# Patient Record
Sex: Female | Born: 1980 | Race: White | Hispanic: No | Marital: Married | Smoking: Current some day smoker
Health system: Southern US, Community
[De-identification: ages and names within clinical notes are randomized; demographics above are authoritative.]

---

## 2002-07-01 ENCOUNTER — Other Ambulatory Visit: Admission: RE | Admit: 2002-07-01 | Discharge: 2002-07-01 | Payer: Self-pay | Admitting: Obstetrics & Gynecology

## 2003-10-13 ENCOUNTER — Other Ambulatory Visit: Admission: RE | Admit: 2003-10-13 | Discharge: 2003-10-13 | Payer: Self-pay | Admitting: Obstetrics and Gynecology

## 2008-10-21 ENCOUNTER — Ambulatory Visit: Payer: Self-pay | Admitting: Family Medicine

## 2008-10-21 DIAGNOSIS — G5780 Other specified mononeuropathies of unspecified lower limb: Secondary | ICD-10-CM | POA: Insufficient documentation

## 2008-10-21 DIAGNOSIS — R209 Unspecified disturbances of skin sensation: Secondary | ICD-10-CM

## 2008-10-25 LAB — CONVERTED CEMR LAB
ALT: 31 units/L (ref 0–35)
Albumin: 4.7 g/dL (ref 3.5–5.2)
CO2: 18 meq/L — ABNORMAL LOW (ref 19–32)
Chloride: 103 meq/L (ref 96–112)
Glucose, Bld: 83 mg/dL (ref 70–99)
Platelets: 354 10*3/uL (ref 150–400)
Potassium: 4.6 meq/L (ref 3.5–5.3)
RBC: 4.66 M/uL (ref 3.87–5.11)
Sodium: 139 meq/L (ref 135–145)
TSH: 2.534 microintl units/mL (ref 0.350–4.50)
Total Protein: 8 g/dL (ref 6.0–8.3)
Vitamin B-12: 770 pg/mL (ref 211–911)
WBC: 8.1 10*3/uL (ref 4.0–10.5)

## 2008-11-03 ENCOUNTER — Encounter: Payer: Self-pay | Admitting: Family Medicine

## 2008-11-03 ENCOUNTER — Ambulatory Visit: Payer: Self-pay | Admitting: Physical Medicine & Rehabilitation

## 2008-11-04 ENCOUNTER — Telehealth: Payer: Self-pay | Admitting: Family Medicine

## 2008-11-04 DIAGNOSIS — S8410XA Injury of peroneal nerve at lower leg level, unspecified leg, initial encounter: Secondary | ICD-10-CM | POA: Insufficient documentation

## 2008-11-05 ENCOUNTER — Encounter: Payer: Self-pay | Admitting: Family Medicine

## 2010-11-15 ENCOUNTER — Encounter: Payer: Self-pay | Admitting: Family Medicine

## 2011-02-07 NOTE — Letter (Signed)
Summary: [facility]  [facility]   Imported By: Kassie Mends 02/01/2011 08:24:38  _____________________________________________________________________  External Attachment:    Type:   Image     Comment:   External Document

## 2011-08-01 ENCOUNTER — Encounter: Payer: Self-pay | Admitting: Family Medicine

## 2011-08-07 ENCOUNTER — Encounter: Payer: Self-pay | Admitting: Family Medicine

## 2011-08-13 ENCOUNTER — Ambulatory Visit (INDEPENDENT_AMBULATORY_CARE_PROVIDER_SITE_OTHER): Payer: 59 | Admitting: Family Medicine

## 2011-08-13 ENCOUNTER — Encounter: Payer: Self-pay | Admitting: Family Medicine

## 2011-08-13 ENCOUNTER — Other Ambulatory Visit: Payer: Self-pay | Admitting: Family Medicine

## 2011-08-13 DIAGNOSIS — D229 Melanocytic nevi, unspecified: Secondary | ICD-10-CM

## 2011-08-13 DIAGNOSIS — D239 Other benign neoplasm of skin, unspecified: Secondary | ICD-10-CM

## 2011-08-13 DIAGNOSIS — Z23 Encounter for immunization: Secondary | ICD-10-CM
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
## 2011-08-13 NOTE — Progress Notes (Signed)
Addended by: Wyline Beady on: 08/13/2011 02:03 PM   Modules accepted: Orders

## 2011-08-14 ENCOUNTER — Telehealth: Payer: Self-pay | Admitting: Family Medicine

## 2011-08-14 NOTE — Telephone Encounter (Signed)
Cal pt: Bx shows just a normal mole that was inflammed.

## 2011-08-15 NOTE — Telephone Encounter (Signed)
Pt informed that her recent biopsy just showed a normal mole that had gotten inflammed. Jarvis Newcomer, LPN Dom[facility] Dimes

## 2012-02-25 ENCOUNTER — Ambulatory Visit (INDEPENDENT_AMBULATORY_CARE_PROVIDER_SITE_OTHER): Payer: 59 | Admitting: Physician Assistant

## 2012-02-25 ENCOUNTER — Encounter: Payer: Self-pay | Admitting: Physician Assistant

## 2012-02-25 VITALS — BP 117/82 | HR 71 | Temp 98.7°F | Ht 67.0 in | Wt 178.0 lb

## 2012-02-25 DIAGNOSIS — H109 Unspecified conjunctivitis: Secondary | ICD-10-CM

## 2012-02-25 MED ORDER — MOXIFLOXACIN HCL 0.5 % OP SOLN - NO CHARGE: 1.0000 [drp] | Freq: Three times a day (TID) | OPHTHALMIC | Status: DC

## 2012-02-25 NOTE — Progress Notes (Signed)
  Subjective:    Patient ID: April Jarvis, female    DOB: 26-Feb-1981, 31 y.o.   MRN: [id number]  Conjunctivitis  The current episode started 2 days ago. The onset was sudden. The problem occurs occasionally. The problem has been gradually worsening. The problem is mild. Relieved by: warm washclothes. The symptoms are aggravated by nothing. Associated symptoms include eye itching, eye discharge and eye redness. Pertinent negatives include no fever, no decreased vision, no double vision, no photophobia, no abdominal pain, no nausea, no vomiting, no congestion, no ear discharge, no ear pain, no headaches, no hearing loss, no rhinorrhea, no sore throat, no swollen glands, no cough, no URI, no wheezing and no eye pain.   82 month old daughter had conjunctivitis last week. It has resolved with antibiotic drops.   Review of Systems  Constitutional: Negative for fever.  HENT: Negative for hearing loss, ear pain, congestion, sore throat, rhinorrhea and ear discharge.   Eyes: Positive for discharge, redness and itching. Negative for double vision, photophobia and pain.  Respiratory: Negative for cough and wheezing.   Gastrointestinal: Negative for nausea, vomiting and abdominal pain.  Neurological: Negative for headaches.       Objective:   Physical Exam  Constitutional: She is oriented to person, place, and time. She appears well-developed and well-nourished.  HENT:  Head: Normocephalic and atraumatic.  Right Ear: External ear normal.  Left Ear: External ear normal.  Nose: Nose normal.  Mouth/Throat: Oropharynx is clear and moist.       TM's normal.  Eyes: Right eye exhibits no discharge. Left eye exhibits no discharge. Scleral icterus is present.       Conjunctivia injected in left eye with no discharge present in either eye.  Neck: Normal range of motion. Neck supple.  Cardiovascular: Normal rate, regular rhythm and normal heart sounds.   Pulmonary/Chest: Effort normal and breath  sounds normal.  Lymphadenopathy:    She has no cervical adenopathy.  Neurological: She is alert and oriented to person, place, and time.  Skin: Skin is warm and dry. There is erythema.  Psychiatric: She has a normal mood and affect. Her behavior is normal.          Assessment & Plan:  Conjunctivitis- Viagamox was given to use 3 times a day for 7 days. Call if not improving.

## 2012-02-25 NOTE — Patient Instructions (Signed)
Start Vigamox three times day for 7 days.

## 2012-02-27 ENCOUNTER — Encounter: Payer: Self-pay | Admitting: *Deleted

## 2012-03-04 ENCOUNTER — Encounter: Payer: Self-pay | Admitting: Physician Assistant

## 2012-03-04 ENCOUNTER — Ambulatory Visit (INDEPENDENT_AMBULATORY_CARE_PROVIDER_SITE_OTHER): Payer: 59 | Admitting: Physician Assistant

## 2012-03-04 VITALS — BP 135/94 | HR 63 | Wt 174.0 lb

## 2012-03-04 DIAGNOSIS — Z13220 Encounter for screening for lipoid disorders: Secondary | ICD-10-CM

## 2012-03-04 DIAGNOSIS — Z0000 Encounter for general adult medical examination without abnormal findings: Secondary | ICD-10-CM

## 2012-03-04 LAB — LIPID PANEL
HDL: 54 mg/dL (ref >39)
LDL Cholesterol: 130 mg/dL — ABNORMAL HIGH (ref 0–99)
Triglycerides: 86 mg/dL (ref <150)
VLDL: 17 mg/dL (ref 0–40)

## 2012-03-04 LAB — COMPLETE METABOLIC PANEL WITH GFR
ALT: 21 U/L (ref 0–35)
AST: 17 U/L (ref 0–37)
CO2: 24 mEq/L (ref 19–32)
Chloride: 105 mEq/L (ref 96–112)
Creat: 0.9 mg/dL (ref 0.50–1.10)
GFR, Est African American: >89 mL/min
Sodium: 138 mEq/L (ref 135–145)
Total Bilirubin: 0.6 mg/dL (ref 0.3–1.2)
Total Protein: 7.3 g/dL (ref 6.0–8.3)

## 2012-03-04 NOTE — Progress Notes (Signed)
Subjective:     April Jarvis is a 31 y.o. female and is here for a comprehensive physical exam. The patient reports problems - she is having some pain and popping in both shoulders that occurs off and on.  patient reports that the pain has been on and off for at least one year. It is not every day but usually a couple days a week. It is worse in the mornings when she gets up and gets better throughout the day. She denies any numbness or tingling bilaterally.She is having no pain today. She takes ibuprofen as needed and it does help with the pain.  Patient has really been working at weight loss. She is running at least 3 times a week and preparing to run a 5K at the end of this month. She believes that the phentarmine has helped her with her appetite she has lost 4 more pounds since last visit on March 11. She is also been more intentional about her diet. In the past she is taking a multivitamin however now she is not.  History   Social History  . Marital Status: Married    Spouse Name: N/A    Number of Children: N/A  . Years of Education: N/A   Occupational History  . Not on file.   Social History Main Topics  . Smoking status: Former Smoker -- 0.1 packs/day    Types: Cigarettes    Quit date: 07/17/2008  . Smokeless tobacco: Not on file  . Alcohol Use: 1.0 oz/week    2 drink(s) per week  . Drug Use: No  . Sexually Active: Not on file   Other Topics Concern  . Not on file   Social History Narrative  . No narrative on file   Health Maintenance  Topic Date Due  . Influenza Vaccine  09/26/2012  . Pap Smear  12/17/2014  . Tetanus/tdap  08/12/2021    The following portions of the patient's history were reviewed and updated as appropriate: allergies, current medications, past family history, past medical history, past social history, past surgical history and problem list.  Review of Systems Pertinent items are noted in HPI.   Objective:    BP 135/94  Pulse 63  Wt 174  lb (78.926 kg)  LMP 02/22/2012 General appearance: alert, cooperative and appears stated age Head: Normocephalic, without obvious abnormality, atraumatic Eyes: conjunctivae/corneas clear. PERRL, EOM's intact. Fundi benign. Ears: normal TM's and external ear canals both ears Nose: Nares normal. Septum midline. Mucosa normal. No drainage or sinus tenderness. Throat: lips, mucosa, and tongue normal; teeth and gums normal Neck: no adenopathy, no carotid bruit, no JVD, supple, symmetrical, trachea midline and thyroid not enlarged, symmetric, no tenderness/mass/nodules Back: symmetric, no curvature. ROM normal. No CVA tenderness. Lungs: clear to auscultation bilaterally Heart: regular rate and rhythm, S1, S2 normal, no murmur, click, rub or gallop Abdomen: soft, non-tender; bowel sounds normal; no masses,  no organomegaly Extremities: extremities normal, atraumatic, no cyanosis or edema and ROM in both shoulders is normal and without pain. No pain to palpation of both shoulders and strength is 5/5.  Pulses: 2+ and symmetric Skin: Skin color, texture, turgor normal. No rashes or lesions Lymph nodes: Cervical, supraclavicular, and axillary nodes normal. Neurologic: Grossly normal    Assessment:    Healthy female exam.      Plan:    Continue with running and cardio exercises at least 3 times a week. Maintain high protein diet that is balanced. We will call with lab  result in 2-3 days. Make sure MVI with at least 500mg  Calcium twice a day and can add Vitamin D3 400 units.   Vaccines are up-to-date. See After Visit Summary for Counseling Recommendations

## 2012-03-04 NOTE — Patient Instructions (Signed)
Continue with running and cardio exercises at least 3 times a week. Maintain high protein diet that is balanced. We will call with result in 2-3 days. Make sure MVI with at least 500mg  Calcium twice a day and can add Vitamin D3 400 units.

## 2012-08-25 ENCOUNTER — Ambulatory Visit (INDEPENDENT_AMBULATORY_CARE_PROVIDER_SITE_OTHER): Payer: 59

## 2012-08-25 ENCOUNTER — Ambulatory Visit (INDEPENDENT_AMBULATORY_CARE_PROVIDER_SITE_OTHER): Payer: 59 | Admitting: Family Medicine

## 2012-08-25 ENCOUNTER — Encounter: Payer: Self-pay | Admitting: Family Medicine

## 2012-08-25 VITALS — BP 130/89 | HR 65 | Temp 97.8°F | Ht 67.0 in | Wt 171.0 lb

## 2012-08-25 DIAGNOSIS — B9689 Other specified bacterial agents as the cause of diseases classified elsewhere: Secondary | ICD-10-CM

## 2012-08-25 DIAGNOSIS — R05 Cough: Secondary | ICD-10-CM

## 2012-08-25 DIAGNOSIS — R0989 Other specified symptoms and signs involving the circulatory and respiratory systems: Secondary | ICD-10-CM

## 2012-08-25 DIAGNOSIS — R062 Wheezing: Secondary | ICD-10-CM

## 2012-08-25 DIAGNOSIS — J209 Acute bronchitis, unspecified: Secondary | ICD-10-CM

## 2012-08-25 MED ORDER — DOXYCYCLINE HYCLATE 100 MG PO TABS: ORAL_TABLET | ORAL | Status: AC

## 2012-08-25 NOTE — Progress Notes (Signed)
CC: April Jarvis is a 31 y.o. female is here for Cough   Subjective: HPI:  4 weeks of a persistent mildly productive cough is present all hours of the day the more bothersome during working hours and even in the middle the night. On occasion it awoke her from her sleep due to the coughing. Originally associated with postnasal drip however now she's only affected with clear anterior nasal discharge is present 24 hours a day. She's never had fevers, chills, nor shortness of breath during this illness. There been any sick contacts nor environmental changes. She's used a handful of over-the-counter treatments including Mucinex products, Tylenol: Sinus products, and Aleve sinus products without much benefit. She denies any chest discomfort but does mention that she can herself wheezing between coughing spells. Other than a two-day self-limited viral gastroenteritis she's not had any major decline or major improvement during the past 4 weeks. She has no history of lung disease. She denies back pain, orthopnea, peripheral edema, ear discomfort, motor sensory disturbance, hearing loss, headaches, sinus pressure, sore throat, nor exertional chest pain.   Review Of Systems Outlined In HPI  No past medical history on file. reviewed and confirmed by patient  Family History  Problem Relation Age of Onset  . Depression Mother   . Hyperlipidemia Father   . Cancer Maternal Grandmother     breast  . Heart disease Maternal Grandfather 8    MI     History  Substance Use Topics  . Smoking status: Former Smoker -- 0.1 packs/day    Types: Cigarettes    Quit date: 07/17/2008  . Smokeless tobacco: Not on file  . Alcohol Use: 1.0 oz/week    2 drink(s) per week     Objective: Filed Vitals:   08/25/12 1551  BP: 130/89  Pulse: 65  Temp: 97.8 F (36.6 C)    General: Alert and Oriented, No Acute Distress HEENT: Pupils equal, round, reactive to light. Conjunctivae clear.  External ears  unremarkable, canals clear with intact TMs with appropriate landmarks.  Middle ear appears open without effusion. Pink inferior turbinates.  Moist mucous membranes, pharynx without inflammation nor lesions.  Neck supple without palpable lymphadenopathy nor abnormal masses. Lungs: Mild wheezing an x-ray face heard in all lung fields with trace rails on the left peripheral field no signs of consolidation.  Comfortable work of breathing. Good air movement. Cardiac: Regular rate and rhythm. Normal S1/S2.  No murmurs, rubs, nor gallops.   Extremities: No peripheral edema.  Strong peripheral pulses.  Skin: Warm and dry.  Assessment & Plan: Ryllie was seen today for cough.  Diagnoses and associated orders for this visit:  Cough - DG Chest 2 View; Future - doxycycline (VIBRA-TABS) 100 MG tablet; One by mouth twice a day for ten days.  Acute bacterial bronchitis - doxycycline (VIBRA-TABS) 100 MG tablet; One by mouth twice a day for ten days.    Personal interpretation a chest x-ray: No signs of consolidation or pulmonary edema however she appears to have peribronchial thickening throughout lung fields. We'll treat for presumed bacterial bronchitis with doxycycline and albuterol for wheezing and cough. Asked her to return if she's not better in one week.Signs and symptoms requring emergent/urgent reevaluation were discussed with the patient.  Return in about 1 week (around 09/01/2012).  Requested Prescriptions   Signed Prescriptions Disp Refills  . doxycycline (VIBRA-TABS) 100 MG tablet 20 tablet 0    Sig: One by mouth twice a day for ten days.

## 2013-01-12 ENCOUNTER — Encounter: Payer: Self-pay | Admitting: Physician Assistant

## 2013-01-12 ENCOUNTER — Ambulatory Visit (INDEPENDENT_AMBULATORY_CARE_PROVIDER_SITE_OTHER): Payer: 59 | Admitting: Physician Assistant

## 2013-01-12 VITALS — BP 104/76 | HR 64 | Temp 98.1°F | Resp 16 | Wt 180.0 lb

## 2013-01-12 DIAGNOSIS — H9201 Otalgia, right ear: Secondary | ICD-10-CM

## 2013-01-12 DIAGNOSIS — H9209 Otalgia, unspecified ear: Secondary | ICD-10-CM

## 2013-01-12 DIAGNOSIS — J329 Chronic sinusitis, unspecified: Secondary | ICD-10-CM

## 2013-01-12 MED ORDER — AMOXICILLIN-POT CLAVULANATE 875-125 MG PO TABS: 1.0000 | ORAL_TABLET | Freq: Two times a day (BID) | ORAL | Status: DC

## 2013-01-12 MED ORDER — FLUTICASONE PROPIONATE 50 MCG/ACT NA SUSP: 2.0000 | Freq: Every day | NASAL | Status: DC

## 2013-01-12 NOTE — Progress Notes (Signed)
  Subjective:    Patient ID: April Jarvis, female    DOB: 08/09/1981, 32 y.o.   MRN: [id number]  Sinusitis This is a new problem. The current episode started 1 to 4 weeks ago. The problem has been gradually worsening (It did get better after 2 or so weeks but then started back with more congestion and ear pain 2 days ago.) since onset. There has been no fever. Her pain is at a severity of 6/10. The pain is moderate. Associated symptoms include congestion, coughing, ear pain, headaches and sinus pressure. Pertinent negatives include no chills, diaphoresis, hoarse voice, neck pain, shortness of breath, sneezing, sore throat or swollen glands. (Cough is mostly dry but some production.  Right ear pain without drainage. 6/10 pain.) Treatments tried: Mucinex d, Nyquil, Dayquil. The treatment provided mild relief.      Review of Systems  Constitutional: Negative for chills and diaphoresis.  HENT: Positive for ear pain, congestion and sinus pressure. Negative for sore throat, hoarse voice, sneezing and neck pain.   Respiratory: Positive for cough. Negative for shortness of breath.   Neurological: Positive for headaches.       Objective:   Physical Exam  Constitutional: She is oriented to person, place, and time. She appears well-developed and well-nourished.  HENT:  Head: Normocephalic and atraumatic.  Right Ear: External ear normal.  Left Ear: External ear normal.  Nose: Nose normal.  Mouth/Throat: Oropharynx is clear and moist.       Right TM very erythematous with some buldging. Left TM completely normal. Both ears no pus or blood and ossicles able to be viewed.   Maxillary tenderness and frontal tenderness to palpation.  Eyes: Conjunctivae normal are normal.  Neck: Normal range of motion. Neck supple.  Cardiovascular: Normal rate, regular rhythm and normal heart sounds.   Pulmonary/Chest: Effort normal and breath sounds normal. She has no wheezes.  Lymphadenopathy:    She has no  cervical adenopathy.  Neurological: She is alert and oriented to person, place, and time.  Skin: Skin is warm and dry.  Psychiatric: She has a normal mood and affect. Her behavior is normal.          Assessment & Plan:  Sinusitis/right ear pain- reassured patient that I did not think ear was infected but very inflammed. I suggest flonase 2 sprays once a day. Augmentin for sinus infection. Gave handout. Continue with mucinex. Call if not improving.

## 2013-01-12 NOTE — Patient Instructions (Addendum)
Start Augmentin. Continue mucinex. Consider flonase I think it might help ear pain. Call if not improving.   Sinusitis Sinusitis is redness, soreness, and swelling (inflammation) of the paranasal sinuses. Paranasal sinuses are air pockets within the bones of your face (beneath the eyes, the middle of the forehead, or above the eyes). In healthy paranasal sinuses, mucus is able to drain out, and air is able to circulate through them by way of your nose. However, when your paranasal sinuses are inflamed, mucus and air can become trapped. This can allow bacteria and other germs to grow and cause infection. Sinusitis can develop quickly and last only a short time (acute) or continue over a long period (chronic). Sinusitis that lasts for more than 12 weeks is considered chronic.  CAUSES  Causes of sinusitis include:  Allergies.  Structural abnormalities, such as displacement of the cartilage that separates your nostrils (deviated septum), which can decrease the air flow through your nose and sinuses and affect sinus drainage.  Functional abnormalities, such as when the small hairs (cilia) that line your sinuses and help remove mucus do not work properly or are not present. SYMPTOMS  Symptoms of acute and chronic sinusitis are the same. The primary symptoms are pain and pressure around the affected sinuses. Other symptoms include:  Upper toothache.  Earache.  Headache.  Bad breath.  Decreased sense of smell and taste.  A cough, which worsens when you are lying flat.  Fatigue.  Fever.  Thick drainage from your nose, which often is green and may contain pus (purulent).  Swelling and warmth over the affected sinuses. DIAGNOSIS  Your caregiver will perform a physical exam. During the exam, your caregiver may:  Look in your nose for signs of abnormal growths in your nostrils (nasal polyps).  Tap over the affected sinus to check for signs of infection.  View the inside of your sinuses  (endoscopy) with a special imaging device with a light attached (endoscope), which is inserted into your sinuses. If your caregiver suspects that you have chronic sinusitis, one or more of the following tests may be recommended:  Allergy tests.  Nasal culture A sample of mucus is taken from your nose and sent to a lab and screened for bacteria.  Nasal cytology A sample of mucus is taken from your nose and examined by your caregiver to determine if your sinusitis is related to an allergy. TREATMENT  Most cases of acute sinusitis are related to a viral infection and will resolve on their own within 10 days. Sometimes medicines are prescribed to help relieve symptoms (pain medicine, decongestants, nasal steroid sprays, or saline sprays).  However, for sinusitis related to a bacterial infection, your caregiver will prescribe antibiotic medicines. These are medicines that will help kill the bacteria causing the infection.  Rarely, sinusitis is caused by a fungal infection. In theses cases, your caregiver will prescribe antifungal medicine. For some cases of chronic sinusitis, surgery is needed. Generally, these are cases in which sinusitis recurs more than 3 times per year, despite other treatments. HOME CARE INSTRUCTIONS   Drink plenty of water. Water helps thin the mucus so your sinuses can drain more easily.  Use a humidifier.  Inhale steam 3 to 4 times a day (for example, sit in the bathroom with the shower running).  Apply a warm, moist washcloth to your face 3 to 4 times a day, or as directed by your caregiver.  Use saline nasal sprays to help moisten and clean your sinuses.  Take over-the-counter or prescription medicines for pain, discomfort, or fever only as directed by your caregiver. SEEK IMMEDIATE MEDICAL CARE IF:  You have increasing pain or severe headaches.  You have nausea, vomiting, or drowsiness.  You have swelling around your face.  You have vision problems.  You  have a stiff neck.  You have difficulty breathing. MAKE SURE YOU:   Understand these instructions.  Will watch your condition.  Will get help right away if you are not doing well or get worse. Document Released: 12/03/2005 Document Revised: 02/25/2012 Document Reviewed: 12/18/2011 [facility] Patient Information 2013 [place], [place].

## 2013-03-16 IMAGING — CR DG CHEST 2V
2 series · 2 of 2 positions shown · non-contrast
Comparison: None.

CLINICAL DATA: Cough and congestion.

CHEST - 2 VIEW

[view not recorded (1 of 2)]
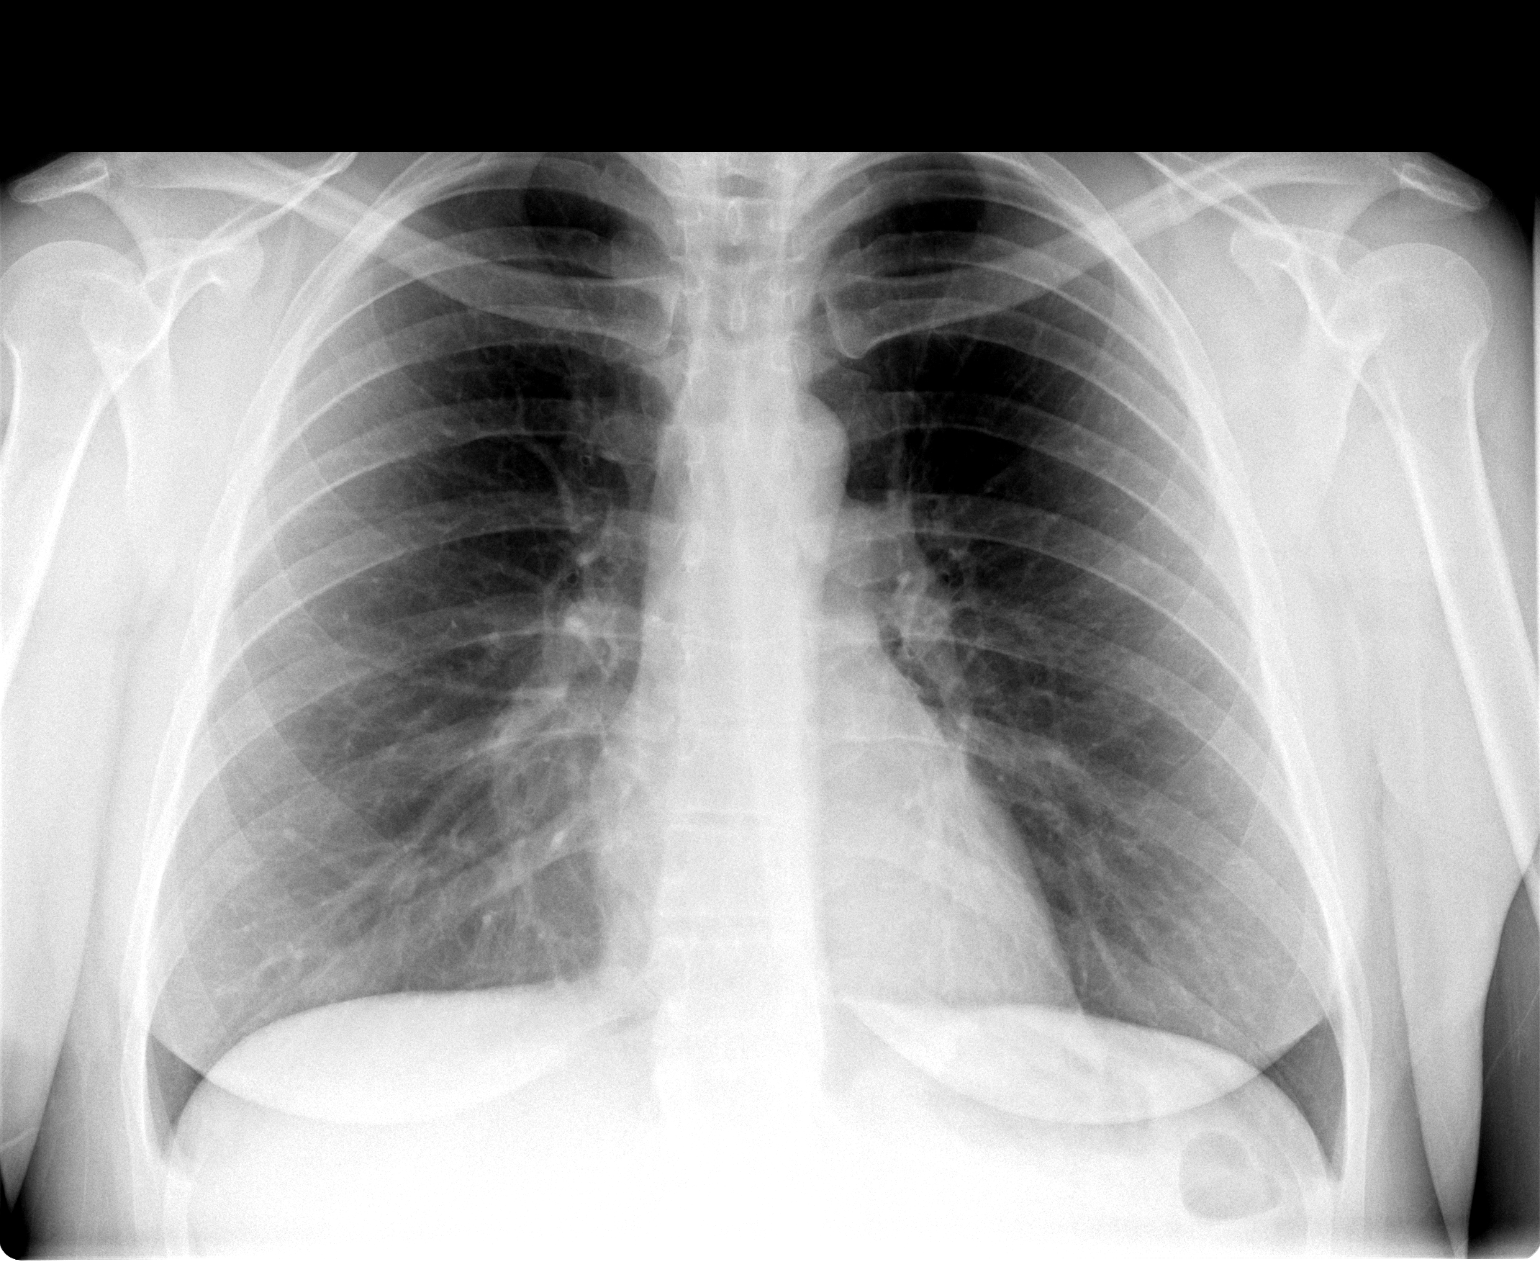

[view not recorded (2 of 2)]
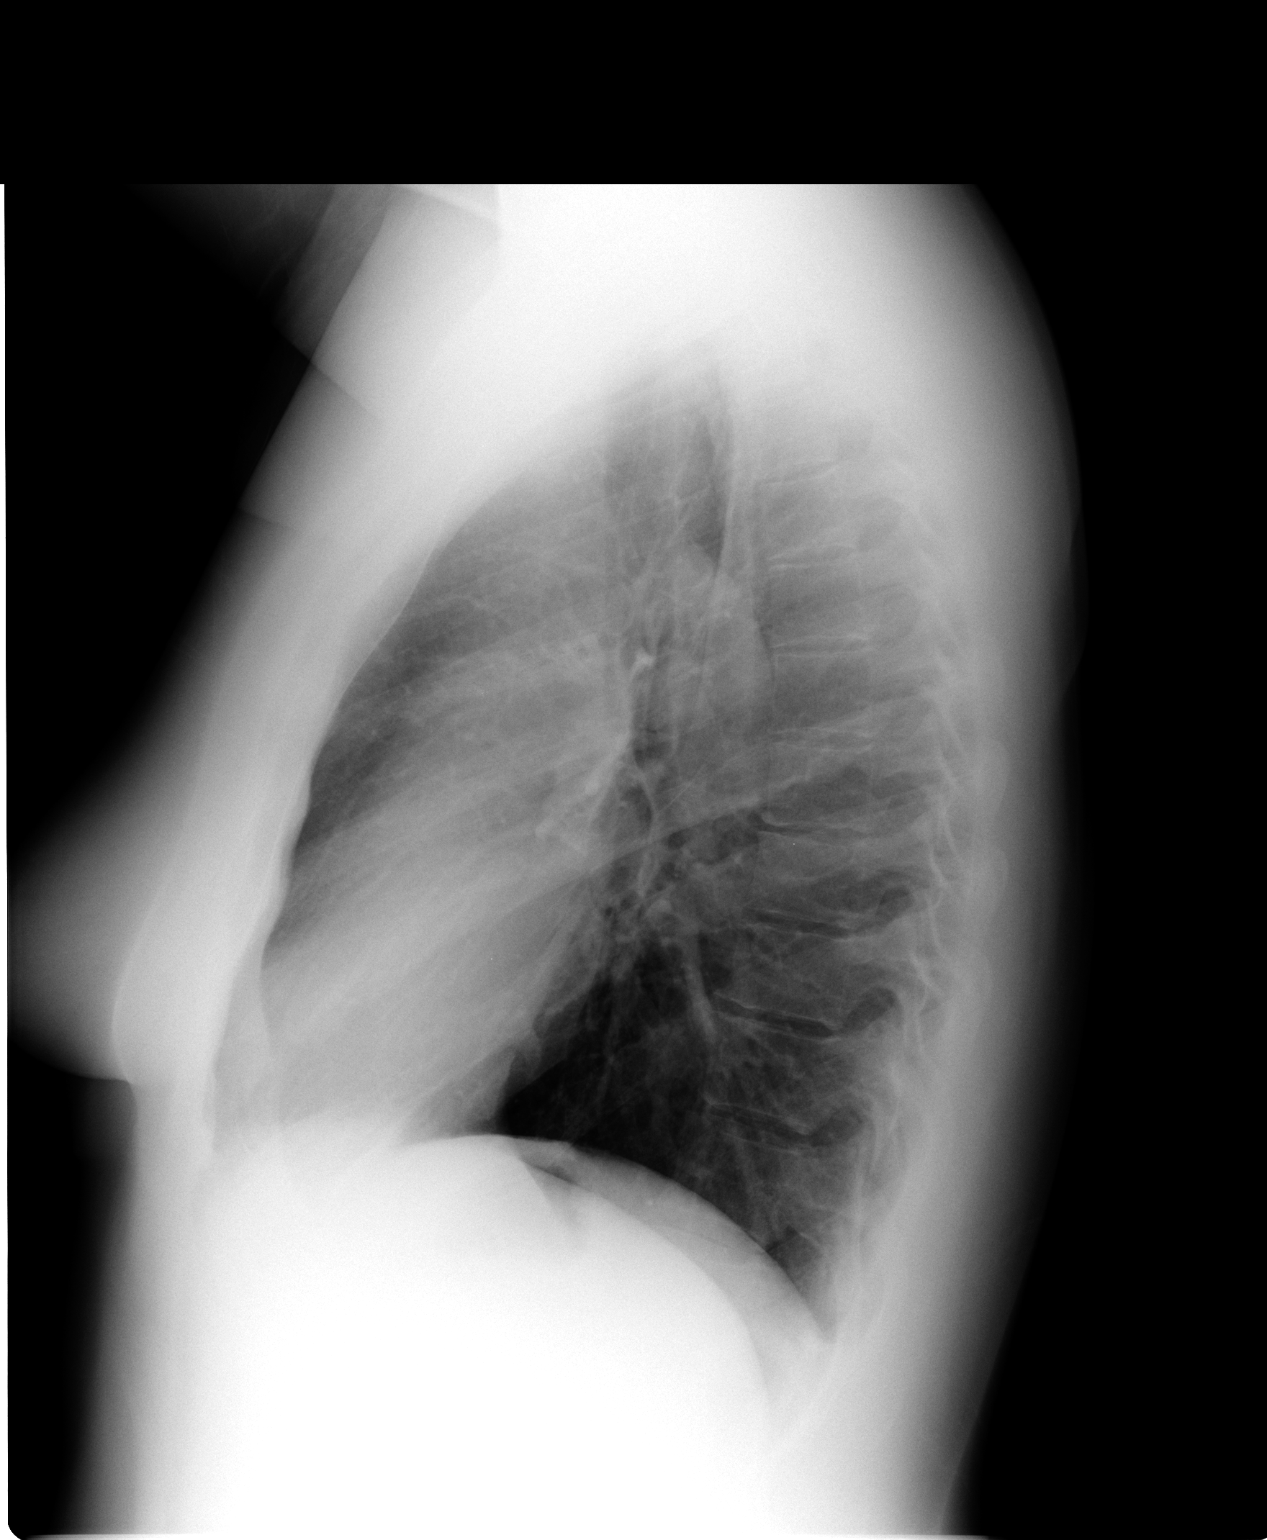

[2 of 2 positions shown; findings below may reference images not displayed]

FINDINGS: Trachea is midline.  Heart size normal.  Lungs are clear.
No pleural fluid.
IMPRESSION: No acute findings.

## 2014-01-22 ENCOUNTER — Ambulatory Visit: Payer: 59 | Admitting: Physician Assistant

## 2014-01-25 ENCOUNTER — Ambulatory Visit (INDEPENDENT_AMBULATORY_CARE_PROVIDER_SITE_OTHER): Payer: 59 | Admitting: Physician Assistant

## 2014-01-25 ENCOUNTER — Encounter: Payer: Self-pay | Admitting: Physician Assistant

## 2014-01-25 VITALS — BP 124/82 | HR 69 | Wt 182.0 lb

## 2014-01-25 DIAGNOSIS — H16139 Photokeratitis, unspecified eye: Secondary | ICD-10-CM

## 2014-01-25 MED ORDER — IMIQUIMOD 3.75 % EX CREA: TOPICAL_CREAM | CUTANEOUS | Status: DC

## 2014-01-25 NOTE — Progress Notes (Signed)
   Subjective:    Patient ID: April Jarvis, female    DOB: [birth year], 33 y.o.   MRN: [id number]  HPI A patient 33 year old female who presents to the clinic with a scaly spot on her left upper for head on her scalp line. She has noticed this for the past couple of months. She will scraped off the scab and it will come back. She has not tried anything on it. She to know what it is. She has no other spots like this.   Review of Systems     Objective:   Physical Exam  HENT:  Head:            Assessment & Plan:  Actinic keratosis-discuss with patient that this is a pre-cancerous lesion. Patient is a fair skin redheaded woman. She has had numerous sun exposure. Discussed the most likely places that actinic keratosis will pop. Gave her the option of her therapy or creams. She opted for creams. Gait her ALT area and sent to pharmacy to use once a day for 2 weeks. This lesion comes back she may have to use a longer treatment. Encouraged sun protection with sunscreen regularly. Followup with anymore lesions. Discussed that without treatment some of these actinic keratosis can turn into skin cancer such as, cell carcinoma. Handout was given for information.

## 2014-01-25 NOTE — Patient Instructions (Signed)
Actinic Keratosis  Actinic keratosis is a precancerous growth on the skin. This means it could develop into skin cancer if it is not treated. About 1% of actinic keratoses turn into skin cancer within a year. It is important to have all such growths removed to prevent them from developing into skin cancer.  CAUSES   Actinic keratosis is caused by getting too much ultraviolet (UV) radiation from the sun or other UV light sources.  RISK FACTORS  Factors that increase your chances of getting actinic keratosis include:   Having light-colored skin and blue eyes.   Having blonde or red hair.   Spending a lot of time in the sun.   Age. The risk of actinic keratosis increases with age.  SYMPTOMS   Actinic keratosis growths look like scaly, rough spots of skin. They can be as small as a pinhead or as big as a quarter. They may itch, hurt, or feel sensitive. Sometimes there is a little tag of pink or gray skin growing off them. In some cases, actinic keratoses are easier felt than seen. They do not go away with the use of moisturizing lotions or creams. Actinic keratoses appear most often on areas of skin that get a lot of sun exposure. These areas include the:   Scalp.   Face.   Ears.   Lips.   Upper back.   Backs of the hands.   Forearms.  DIAGNOSIS   Your caregiver can usually tell what is wrong by performing a physical exam. A tissue sample (biopsy) may also be taken and examined under a microscope.  TREATMENT   Actinic keratosis can be treated several ways. Most treatments can be done in your caregiver's office. Treatment options may include:   Curettage. A tool is used to gently scrape off the growth.   Cryosurgery. Liquid nitrogen is applied to the growth to freeze it. The growth eventually falls off the skin.   Medicated creams, such as 5-fluorouracil or imiquimod. The medicine destroys the cells in the growth.    Chemical peels. Chemicals are applied to the growth and the outer layers of skin are peeled off.   Photodynamic therapy. A drug that makes your skin more sensitive to light is applied to the skin. A strong, blue light is aimed at the skin and destroys the growth.  PREVENTION   To prevent future sun damage:   Try to avoid the sun between 10:00 a.m. and 4:00 p.m. when it is the strongest.   Use a sunscreen or sunblock with SPF 30 or greater.   Apply sunscreen at least 30 minutes before exposure to the sun.   Always wear protective hats, clothing, and sunglasses with UV protection.   Avoid medicines, herbs, and foods that increase your sensitivity to sunlight.   Avoid tanning beds.  HOME CARE INSTRUCTIONS    If your skin was covered with a bandage, change and remove the bandage as directed by your caregiver.   Keep the treated area dry as directed by your caregiver.   Apply any creams as prescribed by your caregiver. Follow the directions carefully.   Check your skin regularly for any changes.   Visit a skin doctor (dermatologist) every year for a skin exam.  SEEK MEDICAL CARE IF:    Your skin does not heal and becomes irritated, red, or bleeds.   You notice any changes or new growths on your skin.  Document Released: 03/01/2009 Document Revised: 02/25/2012 Document Reviewed: 01/14/2012  [employer] Patient Information 

## 2014-01-27 ENCOUNTER — Other Ambulatory Visit: Payer: Self-pay | Admitting: *Deleted

## 2014-01-27 MED ORDER — IMIQUIMOD 5 % EX CREA: TOPICAL_CREAM | CUTANEOUS | Status: DC

## 2014-02-22 ENCOUNTER — Encounter: Payer: 59 | Admitting: Physician Assistant

## 2014-02-23 ENCOUNTER — Encounter: Payer: Self-pay | Admitting: Physician Assistant

## 2014-02-23 ENCOUNTER — Ambulatory Visit (INDEPENDENT_AMBULATORY_CARE_PROVIDER_SITE_OTHER): Payer: 59 | Admitting: Physician Assistant

## 2014-02-23 VITALS — BP 121/82 | HR 63 | Wt 181.0 lb

## 2014-02-23 DIAGNOSIS — Z0000 Encounter for general adult medical examination without abnormal findings: Secondary | ICD-10-CM

## 2014-02-23 DIAGNOSIS — Z13220 Encounter for screening for lipoid disorders: Secondary | ICD-10-CM

## 2014-02-23 DIAGNOSIS — R635 Abnormal weight gain: Secondary | ICD-10-CM

## 2014-02-23 DIAGNOSIS — Z131 Encounter for screening for diabetes mellitus: Secondary | ICD-10-CM

## 2014-02-23 LAB — COMPLETE METABOLIC PANEL WITH GFR
ALK PHOS: 44 U/L (ref 39–117)
ALT: 43 U/L — AB (ref 0–35)
AST: 29 U/L (ref 0–37)
Albumin: 4 g/dL (ref 3.5–5.2)
BUN: 15 mg/dL (ref 6–23)
CALCIUM: 9.7 mg/dL (ref 8.4–10.5)
CHLORIDE: 103 meq/L (ref 96–112)
CO2: 27 mEq/L (ref 19–32)
CREATININE: 0.77 mg/dL (ref 0.50–1.10)
GFR, Est African American: >89 mL/min
GFR, Est Non African American: >89 mL/min
Glucose, Bld: 83 mg/dL (ref 70–99)
Potassium: 5.1 mEq/L (ref 3.5–5.3)
Sodium: 137 mEq/L (ref 135–145)
Total Bilirubin: 0.3 mg/dL (ref 0.2–1.2)
Total Protein: 7.1 g/dL (ref 6.0–8.3)

## 2014-02-23 LAB — LIPID PANEL
CHOL/HDL RATIO: 3.2 ratio
Cholesterol: 189 mg/dL (ref 0–200)
HDL: 59 mg/dL (ref >39)
LDL CALC: 106 mg/dL — AB (ref 0–99)
TRIGLYCERIDES: 119 mg/dL (ref <150)
VLDL: 24 mg/dL (ref 0–40)

## 2014-02-23 LAB — TSH: TSH: 1.842 u[IU]/mL (ref 0.350–4.500)

## 2014-02-23 NOTE — Patient Instructions (Signed)

## 2014-02-23 NOTE — Progress Notes (Signed)
  Subjective:     April Jarvis is a 33 y.o. female and is here for a comprehensive physical exam. The patient reports no problems.  History   Social History  . Marital Status: Married    Spouse Name: N/A    Number of Children: N/A  . Years of Education: N/A   Occupational History  . Not on file.   Social History Main Topics  . Smoking status: Current Some Day Smoker -- 0.10 packs/day    Types: Cigarettes    Last Attempt to Quit: 07/17/2008  . Smokeless tobacco: Not on file  . Alcohol Use: 1.0 oz/week    2 drink(s) per week  . Drug Use: No  . Sexual Activity: Not on file   Other Topics Concern  . Not on file   Social History Narrative  . No narrative on file   Health Maintenance  Topic Date Due  . Influenza Vaccine  08/26/2014 (Originally 07/17/2013)  . Pap Smear  09/21/2016  . Tetanus/tdap  08/12/2021    The following portions of the patient's history were reviewed and updated as appropriate: allergies, current medications, past family history, past medical history, past social history, past surgical history and problem list.  Review of Systems A comprehensive review of systems was negative.   Objective:    BP 121/82  Pulse 63  Wt 181 lb (82.101 kg)  LMP 02/17/2014 General appearance: alert, cooperative and appears stated age Head: Normocephalic, without obvious abnormality, atraumatic Eyes: conjunctivae/corneas clear. PERRL, EOM's intact. Fundi benign. Ears: normal TM's and external ear canals both ears Nose: Nares normal. Septum midline. Mucosa normal. No drainage or sinus tenderness. Throat: lips, mucosa, and tongue normal; teeth and gums normal Neck: no adenopathy, no carotid bruit, no JVD, supple, symmetrical, trachea midline and thyroid not enlarged, symmetric, no tenderness/mass/nodules Back: symmetric, no curvature. ROM normal. No CVA tenderness. Lungs: clear to auscultation bilaterally Heart: regular rate and rhythm, S1, S2 normal, no murmur,  click, rub or gallop Abdomen: soft, non-tender; bowel sounds normal; no masses,  no organomegaly Extremities: extremities normal, atraumatic, no cyanosis or edema Pulses: 2+ and symmetric Skin: Skin color, texture, turgor normal. No rashes or lesions Lymph nodes: Cervical, supraclavicular, and axillary nodes normal. Neurologic: Grossly normal    Assessment:    Healthy female exam.      Plan:    CPE- vaccines up to date. Pap smear 2014 and follows with OB/GYN. Declines flu shot. Will get screening labs today. recommened calcium 1200mg  and vitamin D 800 units. Encouraged to keep up regular exercise. Maintain 1200-1500 calorie diet. Depression screen 0/2 negative.  Abnormal weight gain- overweight catergory with running everyday and watching diet. Will check tSH. Discussed logging calories and nutritionist.  See After Visit Summary for Counseling Recommendations

## 2015-02-28 ENCOUNTER — Encounter: Payer: Self-pay | Admitting: Physician Assistant

## 2015-02-28 ENCOUNTER — Ambulatory Visit (INDEPENDENT_AMBULATORY_CARE_PROVIDER_SITE_OTHER): Payer: 59 | Admitting: Physician Assistant

## 2015-02-28 VITALS — BP 118/88 | HR 61 | Wt 182.0 lb

## 2015-02-28 DIAGNOSIS — E663 Overweight: Secondary | ICD-10-CM | POA: Diagnosis not present

## 2015-02-28 DIAGNOSIS — S93409A Sprain of unspecified ligament of unspecified ankle, initial encounter: Secondary | ICD-10-CM | POA: Insufficient documentation

## 2015-02-28 DIAGNOSIS — E785 Hyperlipidemia, unspecified: Secondary | ICD-10-CM | POA: Diagnosis not present

## 2015-02-28 DIAGNOSIS — Z131 Encounter for screening for diabetes mellitus: Secondary | ICD-10-CM

## 2015-02-28 DIAGNOSIS — M25571 Pain in right ankle and joints of right foot: Secondary | ICD-10-CM

## 2015-02-28 DIAGNOSIS — Z0000 Encounter for general adult medical examination without abnormal findings: Secondary | ICD-10-CM

## 2015-02-28 DIAGNOSIS — E781 Pure hyperglyceridemia: Secondary | ICD-10-CM | POA: Insufficient documentation

## 2015-02-28 DIAGNOSIS — S93401A Sprain of unspecified ligament of right ankle, initial encounter: Secondary | ICD-10-CM

## 2015-02-28 DIAGNOSIS — R41840 Attention and concentration deficit: Secondary | ICD-10-CM | POA: Insufficient documentation

## 2015-02-28 LAB — LIPID PANEL
CHOLESTEROL: 195 mg/dL (ref 0–200)
HDL: 73 mg/dL (ref >=46)
LDL Cholesterol: 87 mg/dL (ref 0–99)
Total CHOL/HDL Ratio: 2.7 Ratio
Triglycerides: 175 mg/dL — ABNORMAL HIGH (ref <150)
VLDL: 35 mg/dL (ref 0–40)

## 2015-02-28 LAB — COMPLETE METABOLIC PANEL WITH GFR
ALBUMIN: 4.1 g/dL (ref 3.5–5.2)
ALT: 31 U/L (ref 0–35)
AST: 24 U/L (ref 0–37)
Alkaline Phosphatase: 49 U/L (ref 39–117)
BUN: 19 mg/dL (ref 6–23)
CALCIUM: 9.7 mg/dL (ref 8.4–10.5)
CHLORIDE: 100 meq/L (ref 96–112)
CO2: 25 mEq/L (ref 19–32)
CREATININE: 0.94 mg/dL (ref 0.50–1.10)
GFR, EST NON AFRICAN AMERICAN: 79 mL/min
GLUCOSE: 92 mg/dL (ref 70–99)
POTASSIUM: 4.3 meq/L (ref 3.5–5.3)
Sodium: 137 mEq/L (ref 135–145)
Total Bilirubin: 0.7 mg/dL (ref 0.2–1.2)
Total Protein: 7.4 g/dL (ref 6.0–8.3)

## 2015-02-28 LAB — TSH: TSH: 3.999 u[IU]/mL (ref 0.350–4.500)

## 2015-02-28 MED ORDER — PHENTERMINE HCL 37.5 MG PO TABS: 37.5000 mg | ORAL_TABLET | Freq: Every day | ORAL | Status: DC

## 2015-02-28 MED ORDER — DICLOFENAC SODIUM 2 % TD SOLN: TRANSDERMAL | Status: DC

## 2015-02-28 NOTE — Progress Notes (Signed)
Subjective:     April Jarvis is a 34 y.o. female and is here for a comprehensive physical exam. The patient reports problems - about 1 month ago laterally inverted right ankle. pain and swelling after initial incident. she started back running about 5 days after incident. minimal pain and swelling. still some residual pain around right lateral ankle especially with plantar flexion. she did start epson salt soaks and pain has signifcantly improved. not tried anything else. she can exercise without much difficultly and no real pain.    Pt also wanted to mention some focus issues over last couple of years. No problems in childhood. Notice worsening focus mostly at work not able to finish task. Wonders if she needs evaluation. Never tried anything in the past.   History   Social History  . Marital Status: Married    Spouse Name: N/A  . Number of Children: N/A  . Years of Education: N/A   Occupational History  . Not on file.   Social History Main Topics  . Smoking status: Current Some Day Smoker -- 0.10 packs/day    Types: Cigarettes    Last Attempt to Quit: 07/17/2008  . Smokeless tobacco: Not on file  . Alcohol Use: 1.0 oz/week    2 drink(s) per week  . Drug Use: No  . Sexual Activity: Not on file   Other Topics Concern  . Not on file   Social History Narrative   Health Maintenance  Topic Date Due  . INFLUENZA VACCINE  08/31/2015 (Originally 07/17/2014)  . HIV Screening  08/31/2015 (Originally 04/17/1994)  . PAP SMEAR  09/21/2016  . TETANUS/TDAP  08/12/2021    The following portions of the patient's history were reviewed and updated as appropriate: allergies, current medications, past family history, past medical history, past social history, past surgical history and problem list.  Review of Systems A comprehensive review of systems was negative.   Objective:    BP 118/88 mmHg  Pulse 61  Wt 182 lb (82.555 kg)  SpO2 97%  LMP 02/25/2015 General appearance: alert,  cooperative and appears stated age Head: Normocephalic, without obvious abnormality, atraumatic Eyes: conjunctivae/corneas clear. PERRL, EOM's intact. Fundi benign. Ears: normal TM's and external ear canals both ears Nose: Nares normal. Septum midline. Mucosa normal. No drainage or sinus tenderness. Throat: lips, mucosa, and tongue normal; teeth and gums normal Neck: no adenopathy, no carotid bruit, no JVD, supple, symmetrical, trachea midline and thyroid not enlarged, symmetric, no tenderness/mass/nodules Back: symmetric, no curvature. ROM normal. No CVA tenderness. Lungs: clear to auscultation bilaterally Heart: regular rate and rhythm, S1, S2 normal, no murmur, click, rub or gallop Abdomen: soft, non-tender; bowel sounds normal; no masses,  no organomegaly Extremities: extremities normal, atraumatic, no cyanosis or edema normal ROM of right ankle. No tenderness over right ankle or lateral malleous. Strength 5/5. No brusing or swelling. No tenderness over forefoot.  Pulses: 2+ and symmetric Skin: Skin color, texture, turgor normal. No rashes or lesions Lymph nodes: Cervical, supraclavicular, and axillary nodes normal. Neurologic: Grossly normal    Assessment:    Healthy female exam.      Plan:    CPe- pt sees GYN for pap and OCP refill. Declined flu shot. Other vaccines up to date. Fasting labs ordered. Weight discussed had. Encouraged calcium and vitamin d.   Overweight- TSH ordered. phentermine given for 1 month. Pt has tolerated in past. Discussed side effects.continue exercise. Consider diet changes to help with 10-15lb weight los.   Lateral right ankle  sprain- evaluation seems consistent with lateral ankle sprain. Continue epson salt soaks. Wear ankle brace for stablity when exercising. pennsaid sample given bid for next couple of weeks. PE unremarkable. No need for imaging. Exercise to strength lateral ankle given. Reassured pt that sprains can take weeks to heal.   Inattention-  phentermine acting as stimulant can help. Start phentermine and report if helping with focus. Will consider formal evaluation before prescribing any  Medications for ADD. Certainly exercise can help with this.  See After Visit Summary for Counseling Recommendations

## 2015-02-28 NOTE — Patient Instructions (Signed)
Acute Ankle Sprain with Phase II Rehab An acute ankle sprain is a partial or complete tear in one or more of the ligaments of the ankle due to traumatic injury. The severity of the injury depends on both the number of ligaments sprained and the grade of sprain. There are 3 grades of sprains.  A grade 1 sprain is a mild sprain. There is a slight pull without obvious tearing. There is no loss of strength, and the muscle and ligament are the correct length.  A grade 2 sprain is a moderate sprain. There is tearing of fibers within the substance of the ligament where it connects two bones or two cartilages. The length of the ligament is increased, and there is usually decreased strength.  A grade 3 sprain is a complete rupture of the ligament and is uncommon. In addition to the grade of sprain, there are 3 types of ankle sprains.  Lateral ankle sprains. This is a sprain of one or more of the 3 ligaments on the outer side (lateral) of the ankle. These are the most common sprains. Medial ankle sprains. There is one large triangular ligament on the inner side (medial) of the ankle that is susceptible to injury. Medial ankle sprains are less common. Syndesmosis, "high ankle," sprains. The syndesmosis is the ligament that connects the two bones of the lower leg. Syndesmosis sprains usually only occur with very severe ankle sprains. SYMPTOMS  Pain, tenderness, and swelling in the ankle, starting at the side of injury that may progress to the whole ankle and foot with time.  "Pop" or tearing sensation at the time of injury.  Bruising that may spread to the heel.  Impaired ability to walk soon after injury. CAUSES   Acute ankle sprains are caused by trauma placed on the ankle that temporarily forces or pries the anklebone (talus) out of its normal socket.  Stretching or tearing of the ligaments that normally hold the joint in place (usually due to a twisting injury). RISK INCREASES WITH:  Previous  ankle sprain.  Sports in which the foot may land awkwardly (basketball, volleyball, soccer) or walking or running on uneven or rough surfaces.  Shoes with inadequate support to prevent sideways motion when stress occurs.  Poor strength and flexibility.  Poor balance skills.  Contact sports. PREVENTION  Warm up and stretch properly before activity.  Maintain physical fitness:  Ankle and leg flexibility, muscle strength, and endurance.  Cardiovascular fitness.  Balance training activities.  Use proper technique and have a coach correct improper technique.  Taping, protective strapping, bracing, or high-top tennis shoes may help prevent injury. Initially, tape is best. However, it loses most of its support function within 10 to 15 minutes.  Wear proper fitted protective shoes. Combining high-top shoes with taping or bracing is more effective than using either alone.  Provide the ankle with support during sports and practice activities for 12 months following injury. PROGNOSIS   If treated properly, ankle sprains can be expected to recover completely. However, the length of recovery depends on the degree of injury.  A grade 1 sprain usually heals enough in 5 to 7 days to allow modified activity and requires an average of 6 weeks to heal completely.  A grade 2 sprain requires 6 to 10 weeks to heal completely.  A grade 3 sprain requires 12 to 16 weeks to heal.  A syndesmosis sprain often takes more than 3 months to heal. RELATED COMPLICATIONS   Frequent recurrence of symptoms may result  in a chronic problem. Appropriately addressing the problem the first time decreases the frequency of recurrence and optimizes healing time. Severity of initial sprain does not predict the likelihood of later instability.  Injury to other structures (bone, cartilage, or tendon).  Chronically unstable or arthritic ankle joint are possible with repeated sprains. TREATMENT Treatment initially  involves the use of ice, medicine, and compression bandages to help reduce pain and inflammation. Ankle sprains are usually immobilized in a walking cast or boot to allow for healing. Crutches may be recommended to reduce pressure on the injury. After immobilization, strengthening and stretching exercises may be necessary to regain strength and a full range of motion. Surgery is rarely needed to treat ankle sprains. MEDICATION   Nonsteroidal anti-inflammatory medicines, such as aspirin and ibuprofen (do not take for the first 3 days after injury or within 7 days before surgery), or other minor pain relievers, such as acetaminophen, are often recommended. Take these as directed by your caregiver. Contact your caregiver immediately if any bleeding, stomach upset, or signs of an allergic reaction occur from these medicines.  Ointments applied to the skin may be helpful.  Pain relievers may be prescribed as necessary by your caregiver. Do not take prescription pain medicine for longer than 4 to 7 days. Use only as directed and only as much as you need. HEAT AND COLD  Cold treatment (icing) is used to relieve pain and reduce inflammation for acute and chronic cases. Cold should be applied for 10 to 15 minutes every 2 to 3 hours for inflammation and pain and immediately after any activity that aggravates your symptoms. Use ice packs or an ice massage.  Heat treatment may be used before performing stretching and strengthening activities prescribed by your caregiver. Use a heat pack or a warm soak. SEEK IMMEDIATE MEDICAL CARE IF:   Pain, swelling, or bruising worsens despite treatment.  You experience pain, numbness, discoloration, or coldness in the foot or toes.  New, unexplained symptoms develop. (Drugs used in treatment may produce side effects.) EXERCISES  PHASE II EXERCISES RANGE OF MOTION (ROM) AND STRETCHING EXERCISES - Ankle Sprain, Acute-Phase II, Weeks 3 to 4 After your physician, physical  therapist, or athletic trainer feels your knee has made progress significant enough to begin more advanced exercises, he or she may recommend completing some of the following exercises. Although each person heals at different rates, most people will be ready for these exercises between 3 and 4 weeks after their injury. Do not begin these exercises until you have your caregiver's permission. He or she may also advise you to continue with the exercises which you completed in Phase I of your rehabilitation. While completing these exercises, remember:   Restoring tissue flexibility helps normal motion to return to the joints. This allows healthier, less painful movement and activity.  An effective stretch should be held for at least 30 seconds.  A stretch should never be painful. You should only feel a gentle lengthening or release in the stretched tissue. RANGE OF MOTION - Ankle Plantar Flexion   Sit with your right / left leg crossed over your opposite knee.  Use your opposite hand to pull the top of your foot and toes toward you.  You should feel a gentle stretch on the top of your foot/ankle. Hold this position for __________. Repeat __________ times. Complete __________ times per day.  RANGE OF MOTION - Ankle Eversion  Sit with your right / left ankle crossed over your opposite knee.    Grip your foot with your opposite hand, placing your thumb on the top of your foot and your fingers across the bottom of your foot.  Gently push your foot downward with a slight rotation so your littlest toes rise slightly  You should feel a gentle stretch on the inside of your ankle. Hold the stretch for __________ seconds. Repeat __________ times. Complete this exercise __________ times per day.  RANGE OF MOTION - Ankle Inversion  Sit with your right / left ankle crossed over your opposite knee.  Grip your foot with your opposite hand, placing your thumb on the bottom of your foot and your fingers across  the top of your foot.  Gently pull your foot so the smallest toe comes toward you and your thumb pushes the inside of the ball of your foot away from you.  You should feel a gentle stretch on the outside of your ankle. Hold the stretch for __________ seconds. Repeat __________ times. Complete this exercise __________ times per day.  STRETCH - Gastrocsoleus  Sit with your right / left leg extended. Holding onto both ends of a belt or towel, loop it around the ball of your foot.  Keeping your right / left ankle and foot relaxed and your knee straight, pull your foot and ankle toward you using the belt/towel.  You should feel a gentle stretch behind your calf or knee. Hold this position for __________ seconds. Repeat __________ times. Complete this stretch __________ times per day.  RANGE OF MOTION - Ankle Dorsiflexion, Active Assisted  Remove shoes and sit on a chair that is preferably not on a carpeted surface.  Place right / left foot under knee. Extend your opposite leg for support.  Keeping your heel down, slide your right / left foot back toward the chair until you feel a stretch at your ankle or calf. If you do not feel a stretch, slide your bottom forward to the edge of the chair while still keeping your heel down.  Hold this stretch for __________ seconds. Repeat __________ times. Complete this stretch __________ times per day.  STRETCH - Gastroc, Standing   Place hands on wall.  Extend right / left leg and place a folded washcloth under the arch of your foot for support. Keep the front knee somewhat bent.  Slightly point your toes inward on your back foot.  Keeping your right / left heel on the floor and your knee straight, shift your weight toward the wall, not allowing your back to arch.  You should feel a gentle stretch in the calf. Hold this position for __________ seconds. Repeat __________ times. Complete this stretch __________ times per day. STRETCH - Soleus,  Standing  Place hands on wall.  Extend right / left leg and place a folded washcloth under the arch of your foot for support. Keep the front knee somewhat bent.  Slightly point your toes inward on your back foot.  Keep your right / left heel on the floor, bend your back knee, and slightly shift your weight over the back leg so that you feel a gentle stretch deep in your back calf.  Hold this position for __________ seconds. Repeat __________ times. Complete this stretch __________ times per day. STRETCH - Gastrocsoleus, Standing Note: This exercise can place a lot of stress on your foot and ankle. Please complete this exercise only if specifically instructed by your caregiver.   Place the ball of your right / left foot on a step, keeping your other   foot firmly on the same step.  Hold on to the wall or a rail for balance.  Slowly lift your other foot, allowing your body weight to press your heel down over the edge of the step.  You should feel a stretch in your right / left calf.  Hold this position for __________ seconds.  Repeat this exercise with a slight bend in your knee. Repeat __________ times. Complete this stretch __________ times per day.  STRENGTHENING EXERCISES - Ankle Sprain, Acute-Phase II Around 3 to 4 weeks after your injury, you may progress to some of these exercises in your rehabilitation program. Do not begin these until you have your caregiver's permission. Although your condition has improved, the Phase I exercises will continue to be helpful and you may continue to complete them. As you complete strengthening exercises, remember:   Strong muscles with good endurance tolerate stress better.  Do the exercises as initially prescribed by your caregiver. Progress slowly with each exercise, gradually increasing the number of repetitions and weight used under his or her guidance.  You may experience muscle soreness or fatigue, but the pain or discomfort you are trying  to eliminate should never worsen during these exercises. If this pain does worsen, stop and make certain you are following the directions exactly. If the pain is still present after adjustments, discontinue the exercise until you can discuss the trouble with your caregiver. STRENGTH - Plantar-flexors, Standing  Stand with your feet shoulder width apart. Steady yourself with a wall or table using as little support as needed.  Keeping your weight evenly spread over the width of your feet, rise up on your toes.*  Hold this position for __________ seconds. Repeat __________ times. Complete this exercise __________ times per day.  *If this is too easy, shift your weight toward your right / left leg until you feel challenged. Ultimately, you may be asked to do this exercise with your right / left foot only. STRENGTH - Dorsiflexors and Plantar-flexors, Heel/toe Walking  Dorsiflexion: Walk on your heels only. Keep your toes as high as possible.  Walk for ____________________ seconds/feet.  Repeat __________ times. Complete __________ times per day.  Plantar flexion: Walk on your toes only. Keep your heels as high as possible.  Walk for ____________________ seconds/feet. Repeat __________ times. Complete __________ times per day.  BALANCE - Tandem Walking  Place your uninjured foot on a line 2 to 4 inches wide and at least 10 feet long.  Keeping your balance without using anything for extra support, place your right / left heel directly in front of your other foot.  Slowly raise your back foot up, lifting from the heel to the toes, and place it directly in front of the right / left foot.  Continue to walk along the line slowly. Walk for ____________________ feet. Repeat ____________________ times. Complete ____________________ times per day. BALANCE - Inversion/Eversion Use caution, these are advanced level exercises. Do not begin them until you are advised to do so.   Create a balance  board using a sturdy board about 1  feet long and at 1 to 1  feet wide and a 1  inch diameter rod or pipe that is as long as the board's width. A copper pipe or a solid broomstick work well.  Stand on a non-carpeted surface near a countertop or wall. Step onto the board so that your feet are hip-width apart and equally straddle the rod/pipe.  Keeping your feet in place, complete these two exercises   without shifting your upper body or hips:  Tip the board from side-to-side. Control the movement so the board does not forcefully strike the ground. The board should silently tap the ground.  Tip the board side-to-side without striking the ground. Occasionally pause and maintain a steady position at various points.  Repeat the first two exercises, but use only your right / left foot. Place your right / left foot directly over the rod/pipe. Repeat __________ times. Complete this exercise __________ times a day. BALANCE - Plantar/Dorsi Flexion Use caution, these are advanced level exercises. Do not begin them until you are advised to do so.   Create a balance board using a sturdy board about 1  feet long and at 1 to 1  feet wide and a 1  inch diameter rod or pipe that is as long as the board's width. A copper pipe or a solid broomstick work well.  Stand on a non-carpeted surface near a countertop or wall. Stand on the board so that the rod/pipe runs under the arches in your feet.  Keeping your feet in place, complete these two exercises without shifting your upper body or hips:  Tip the board from side-to-side. Control the movement so the board does not forcefully strike the ground. The board should silently tap the ground.  Tip the board side-to-side without striking the ground. Occasionally pause and maintain a steady position at various points.  Repeat the first two exercises, but use only your right / left foot. Stand in the center of the board. Repeat __________ times. Complete this  exercise __________ times a day. STRENGTH - Plantar-flexors, Eccentric Note: This exercise can place a lot of stress on your foot and ankle. Please complete this exercise only if specifically instructed by your caregiver.   Place the balls of your feet on a step. With your hands, use only enough support from a wall or rail to keep your balance.  Keep your knees straight and rise up on your toes.  Slowly shift your weight entirely to your toes and pick up your opposite foot. Gently and with controlled movement, lower your weight through your right / left foot so that your heel drops below the level of the step. You will feel a slight stretch in the back of your calf at the ending position.  Use the healthy leg to help rise up onto the balls of both feet, then lower weight only on the right / left leg again. Build up to 15 repetitions. Then progress to 3 consecutive sets of 15 repetitions.*  After completing the above exercise, complete the same exercise with a slight knee bend (about 30 degrees). Again, build up to 15 repetitions. Then progress to 3 consecutive sets of 15 repetitions.* Perform this exercise __________ times per day.  *When you easily complete 3 sets of 15, your physician, physical therapist, or athletic trainer may advise you to add resistance by wearing a backpack filled with additional weight. Document Released: 03/25/2006 Document Revised: 02/25/2012 Document Reviewed: 03/17/2009 [facility] Patient Information 2015 [place], [place]. This information is not intended to replace advice given to you by your health care provider. Make sure you discuss any questions you have with your health care provider.

## 2015-04-01 ENCOUNTER — Encounter: Payer: Self-pay | Admitting: Physician Assistant

## 2015-04-01 ENCOUNTER — Ambulatory Visit (INDEPENDENT_AMBULATORY_CARE_PROVIDER_SITE_OTHER): Payer: 59 | Admitting: Physician Assistant

## 2015-04-01 VITALS — BP 128/85 | HR 73 | Wt 179.0 lb

## 2015-04-01 DIAGNOSIS — R635 Abnormal weight gain: Secondary | ICD-10-CM | POA: Diagnosis not present

## 2015-04-01 DIAGNOSIS — R41840 Attention and concentration deficit: Secondary | ICD-10-CM

## 2015-04-01 DIAGNOSIS — E663 Overweight: Secondary | ICD-10-CM

## 2015-04-01 MED ORDER — PHENTERMINE HCL 37.5 MG PO TABS: 37.5000 mg | ORAL_TABLET | Freq: Every day | ORAL | Status: DC

## 2015-04-01 NOTE — Progress Notes (Signed)
   Subjective:    Patient ID: April Jarvis, female    DOB: [birth year], 34 y.o.   MRN: [id number]  HPI Pt presents to clinic for follow up on phentermine and weight loss. She is down 3lbs. She has no side effects and feels great. No insomnia, anxiety, palpitations, dry mouth. She does feel like phentermine helps with her focus and job performance. She is running but not regularly. She is trying to keep to a 1500-1800 calorie diet.    Review of Systems  All other systems reviewed and are negative.      Objective:   Physical Exam  Constitutional: She is oriented to person, place, and time. She appears well-developed and well-nourished.  Cardiovascular: Normal rate, regular rhythm and normal heart sounds.   Pulmonary/Chest: Effort normal and breath sounds normal. She has no wheezes.  Neurological: She is alert and oriented to person, place, and time.  Skin: Skin is dry.  Psychiatric: She has a normal mood and affect. Her behavior is normal.          Assessment & Plan:  Abnormal weight gain- BMI 28. Refilled for 90 day supply. Discussed half way through to cut in half. Follow up in 3 months.   Inattention- improving with phentermine. Suggested of some ADD. Consider testing and treatment at end of phentermine therapy.

## 2015-04-26 ENCOUNTER — Ambulatory Visit (INDEPENDENT_AMBULATORY_CARE_PROVIDER_SITE_OTHER): Payer: 59 | Admitting: Physician Assistant

## 2015-04-26 ENCOUNTER — Encounter: Payer: Self-pay | Admitting: Physician Assistant

## 2015-04-26 ENCOUNTER — Ambulatory Visit: Payer: 59 | Admitting: Physician Assistant

## 2015-04-26 VITALS — BP 127/85 | HR 81 | Ht 67.0 in | Wt 179.0 lb

## 2015-04-26 DIAGNOSIS — N3001 Acute cystitis with hematuria: Secondary | ICD-10-CM | POA: Diagnosis not present

## 2015-04-26 DIAGNOSIS — R300 Dysuria: Secondary | ICD-10-CM

## 2015-04-26 LAB — POCT URINALYSIS DIPSTICK
Bilirubin, UA: NEGATIVE
Glucose, UA: NEGATIVE
Ketones, UA: NEGATIVE
Leukocytes, UA: NEGATIVE
NITRITE UA: POSITIVE
PH UA: 6
PROTEIN UA: NEGATIVE
Spec Grav, UA: 1.01
UROBILINOGEN UA: 0.2

## 2015-04-26 MED ORDER — FLUCONAZOLE 150 MG PO TABS: 150.0000 mg | ORAL_TABLET | Freq: Once | ORAL | Status: DC

## 2015-04-26 MED ORDER — CIPROFLOXACIN HCL 500 MG PO TABS: 500.0000 mg | ORAL_TABLET | Freq: Two times a day (BID) | ORAL | Status: DC

## 2015-04-26 NOTE — Addendum Note (Signed)
Addended by: Beatris Ship L on: 04/26/2015 01:36 PM   Modules accepted: Orders

## 2015-04-26 NOTE — Progress Notes (Signed)
   Subjective:    Patient ID: April Jarvis, female    DOB: [birth year], 34 y.o.   MRN: [id number]  HPI Pt presents to the clinic with dysuria that started and woke her up at 2:30am. She could not go back to sleep. No abdominal or flank pain. No fever, chills, body aches. She is having lots of pressure. Took azo once to give some relief. Going out of town tomorrow. Lots of pain and pressure with urination.    Review of Systems  All other systems reviewed and are negative.      Objective:   Physical Exam  Constitutional: She is oriented to person, place, and time. She appears well-developed and well-nourished.  HENT:  Head: Normocephalic and atraumatic.  Cardiovascular: Normal rate, regular rhythm and normal heart sounds.   Pulmonary/Chest: Effort normal and breath sounds normal.  No CVA tenderness.   Abdominal: Soft. Bowel sounds are normal. She exhibits no distension and no mass. There is no tenderness. There is no rebound and no guarding.  Neurological: She is alert and oriented to person, place, and time.  Skin: Skin is dry.  Psychiatric: She has a normal mood and affect. Her behavior is normal.          Assessment & Plan:  Acute cystitis with hematuria- .. Results for orders placed or performed in visit on 04/26/15  POCT urinalysis dipstick  Result Value Ref Range   Color, UA orange    Clarity, UA clear    Glucose, UA neg    Bilirubin, UA neg    Ketones, UA neg    Spec Grav, UA 1.010    Blood, UA trace-lysed    pH, UA 6.0    Protein, UA neg    Urobilinogen, UA 0.2    Nitrite, UA pos    Leukocytes, UA Negative    Will culture. Treated with cipro for 3 days. Diflucan given if develops yeast infection. Increase hydration.

## 2015-04-29 LAB — URINE CULTURE: Colony Count: >=100000

## 2016-10-19 ENCOUNTER — Ambulatory Visit (INDEPENDENT_AMBULATORY_CARE_PROVIDER_SITE_OTHER): Payer: 59 | Admitting: Physician Assistant

## 2016-10-19 ENCOUNTER — Encounter: Payer: Self-pay | Admitting: Physician Assistant

## 2016-10-19 VITALS — BP 115/78 | HR 72 | Ht 67.0 in | Wt 188.0 lb

## 2016-10-19 DIAGNOSIS — E781 Pure hyperglyceridemia: Secondary | ICD-10-CM | POA: Diagnosis not present

## 2016-10-19 DIAGNOSIS — Z0000 Encounter for general adult medical examination without abnormal findings: Secondary | ICD-10-CM | POA: Diagnosis not present

## 2016-10-19 DIAGNOSIS — R41840 Attention and concentration deficit: Secondary | ICD-10-CM

## 2016-10-19 DIAGNOSIS — R635 Abnormal weight gain: Secondary | ICD-10-CM | POA: Diagnosis not present

## 2016-10-19 DIAGNOSIS — Z13220 Encounter for screening for lipoid disorders: Secondary | ICD-10-CM

## 2016-10-19 DIAGNOSIS — Z131 Encounter for screening for diabetes mellitus: Secondary | ICD-10-CM

## 2016-10-19 MED ORDER — BUPROPION HCL ER (XL) 150 MG PO TB24: 150.0000 mg | ORAL_TABLET | ORAL | 2 refills | Status: DC

## 2016-10-19 NOTE — Patient Instructions (Addendum)
Keeping You Healthy  Get These Tests 1. Blood Pressure- Have your blood pressure checked once a year by your health care provider.  Normal blood pressure is 120/80. 2. Weight- Have your body mass index (BMI) calculated to screen for obesity.  BMI is measure of body fat based on height and weight.  You can also calculate your own BMI at GravelBags.it. 3. Cholesterol- Have your cholesterol checked every 5 years starting at age 37 then yearly starting at age 37. 70. Chlamydia, HIV, and other sexually transmitted diseases- Get screened every year until age 61, then within three months of each new sexual provider. 5. Pap Test - Every 1-5 years; discuss with your health care provider. 6. Mammogram- Every 1-2 years starting at age 65--50  Take these medicines  Calcium with Vitamin D-Your body needs 1200 mg of Calcium each day and [phone] IU of Vitamin D daily.  Your body can only absorb 500 mg of Calcium at a time so Calcium must be taken in 2 or 3 divided doses throughout the day.  Multivitamin with folic acid- Once daily if it is possible for you to become pregnant.  Get these Immunizations  Gardasil-Series of three doses; prevents HPV related illness such as genital warts and cervical cancer.  Menactra-Single dose; prevents meningitis.  Tetanus shot- Every 10 years.  Flu shot-Every year.  Take these steps 1. Do not smoke-Your healthcare provider can help you quit.  For tips on how to quit go to www.smokefree.gov or call 1-800 QUITNOW. 2. Be physically active- Exercise 5 days a week for at least 30 minutes.  If you are not already physically active, start slow and gradually work up to 30 minutes of moderate physical activity.  Examples of moderate activity include walking briskly, dancing, swimming, bicycling, etc. 3. Breast Cancer- A self breast exam every month is important for early detection of breast cancer.  For more information and instruction on self breast exams, ask your  healthcare provider or https://www.patel.info/. 4. Eat a healthy diet- Eat a variety of healthy foods such as fruits, vegetables, whole grains, low fat milk, low fat cheeses, yogurt, lean meats, poultry and fish, beans, nuts, tofu, etc.  For more information go to www. Thenutritionsource.org 5. Drink alcohol in moderation- Limit alcohol intake to one drink or less per day. Never drink and drive. 6. Depression- Your emotional health is as important as your physical health.  If you're feeling down or losing interest in things you normally enjoy please talk to your healthcare provider about being screened for depression. 7. Dental visit- Brush and floss your teeth twice daily; visit your dentist twice a year. 8. Eye doctor- Get an eye exam at least every 2 years. 9. Helmet use- Always wear a helmet when riding a bicycle, motorcycle, rollerblading or skateboarding. 33. Safe sex- If you may be exposed to sexually transmitted infections, use a condom. 11. Seat belts- Seat belts can save your live; always wear one. 12. Smoke/Carbon Monoxide detectors- These detectors need to be installed on the appropriate level of your home. Replace batteries at least once a year. 13. Skin cancer- When out in the sun please cover up and use sunscreen 15 SPF or higher. 14. Violence- If anyone is threatening or hurting you, please tell your healthcare provider.   Bupropion extended-release tablets (Depression/Mood Disorders) What is this medicine? BUPROPION (byoo PROE pee on) is used to treat depression. This medicine may be used for other purposes; ask your health care provider or pharmacist if you  have questions. What should I tell my health care provider before I take this medicine? They need to know if you have any of these conditions: -an eating disorder, such as anorexia or bulimia -bipolar disorder or psychosis -diabetes or high blood sugar, treated with medication -glaucoma -head injury  or brain tumor -heart disease, previous heart attack, or irregular heart beat -high blood pressure -kidney or liver disease -seizures (convulsions) -suicidal thoughts or a previous suicide attempt -Tourette's syndrome -weight loss -an unusual or allergic reaction to bupropion, other medicines, foods, dyes, or preservatives -breast-feeding -pregnant or trying to become pregnant How should I use this medicine? Take this medicine by mouth with a glass of water. Follow the directions on the prescription label. You can take it with or without food. If it upsets your stomach, take it with food. Do not crush, chew, or cut these tablets. This medicine is taken once daily at the same time each day. Do not take your medicine more often than directed. Do not stop taking this medicine suddenly except upon the advice of your doctor. Stopping this medicine too quickly may cause serious side effects or your condition may worsen. A special MedGuide will be given to you by the pharmacist with each prescription and refill. Be sure to read this information carefully each time. Talk to your pediatrician regarding the use of this medicine in children. Special care may be needed. Overdosage: If you think you have taken too much of this medicine contact a poison control center or emergency room at once. NOTE: This medicine is only for you. Do not share this medicine with others. What if I miss a dose? If you miss a dose, skip the missed dose and take your next tablet at the regular time. Do not take double or extra doses. What may interact with this medicine? Do not take this medicine with any of the following medications: -linezolid -MAOIs like Azilect, Carbex, Eldepryl, Marplan, Nardil, and Parnate -methylene blue (injected into a vein) -other medicines that contain bupropion like Zyban This medicine may also interact with the following medications: -alcohol -certain medicines for anxiety or sleep -certain  medicines for blood pressure like metoprolol, propranolol -certain medicines for depression or psychotic disturbances -certain medicines for HIV or AIDS like efavirenz, lopinavir, nelfinavir, ritonavir -certain medicines for irregular heart beat like propafenone, flecainide -certain medicines for Parkinson's disease like amantadine, levodopa -certain medicines for seizures like carbamazepine, phenytoin, phenobarbital -cimetidine -clopidogrel -cyclophosphamide -furazolidone -isoniazid -nicotine -orphenadrine -procarbazine -steroid medicines like prednisone or cortisone -stimulant medicines for attention disorders, weight loss, or to stay awake -tamoxifen -theophylline -thiotepa -ticlopidine -tramadol -warfarin This list may not describe all possible interactions. Give your health care provider a list of all the medicines, herbs, non-prescription drugs, or dietary supplements you use. Also tell them if you smoke, drink alcohol, or use illegal drugs. Some items may interact with your medicine. What should I watch for while using this medicine? Tell your doctor if your symptoms do not get better or if they get worse. Visit your doctor or health care professional for regular checks on your progress. Because it may take several weeks to see the full effects of this medicine, it is important to continue your treatment as prescribed by your doctor. Patients and their families should watch out for new or worsening thoughts of suicide or depression. Also watch out for sudden changes in feelings such as feeling anxious, agitated, panicky, irritable, hostile, aggressive, impulsive, severely restless, overly excited and hyperactive, or not being  able to sleep. If this happens, especially at the beginning of treatment or after a change in dose, call your health care professional. Avoid alcoholic drinks while taking this medicine. Drinking large amounts of alcoholic beverages, using sleeping or anxiety  medicines, or quickly stopping the use of these agents while taking this medicine may increase your risk for a seizure. Do not drive or use heavy machinery until you know how this medicine affects you. This medicine can impair your ability to perform these tasks. Do not take this medicine close to bedtime. It may prevent you from sleeping. Your mouth may get dry. Chewing sugarless gum or sucking hard candy, and drinking plenty of water may help. Contact your doctor if the problem does not go away or is severe. The tablet shell for some brands of this medicine does not dissolve. This is normal. The tablet shell may appear whole in the stool. This is not a cause for concern. What side effects may I notice from receiving this medicine? Side effects that you should report to your doctor or health care professional as soon as possible: -allergic reactions like skin rash, itching or hives, swelling of the face, lips, or tongue -breathing problems -changes in vision -confusion -fast or irregular heartbeat -hallucinations -increased blood pressure -redness, blistering, peeling or loosening of the skin, including inside the mouth -seizures -suicidal thoughts or other mood changes -unusually weak or tired -vomiting Side effects that usually do not require medical attention (report to your doctor or health care professional if they continue or are bothersome): -change in sex drive or performance -constipation -headache -loss of appetite -nausea -tremors -weight loss This list may not describe all possible side effects. Call your doctor for medical advice about side effects. You may report side effects to FDA at 1-800-FDA-1088. Where should I keep my medicine? Keep out of the reach of children. Store at room temperature between 15 and 30 degrees C (59 and 86 degrees F). Throw away any unused medicine after the expiration date. NOTE: This sheet is a summary. It may not cover all possible  information. If you have questions about this medicine, talk to your doctor, pharmacist, or health care provider.    2016, Elsevier/Gold Standard. (2013-06-26 12:39:42)

## 2016-10-21 ENCOUNTER — Encounter: Payer: Self-pay | Admitting: Physician Assistant

## 2016-10-21 NOTE — Progress Notes (Signed)
Subjective:     April Jarvis is a 35 y.o. female and is here for a comprehensive physical exam. The patient reports problems - she continues to struggle with inattention and weight. she is exercising 3-4 times a week.. she feels like she is keeping to a good diet. she feels like her brain is scattered and not finishing task. .  Social History   Social History  . Marital status: Married    Spouse name: N/A  . Number of children: N/A  . Years of education: N/A   Occupational History  . Not on file.   Social History Main Topics  . Smoking status: Current Some Day Smoker    Packs/day: 0.10    Types: Cigarettes    Last attempt to quit: 07/17/2008  . Smokeless tobacco: Not on file  . Alcohol use 1.0 oz/week    2 drink(s) per week  . Drug use: No  . Sexual activity: Not on file   Other Topics Concern  . Not on file   Social History Narrative  . No narrative on file   Health Maintenance  Topic Date Due  . HIV Screening  04/17/1994  . INFLUENZA VACCINE  10/19/2017 (Originally 07/17/2016)  . PAP SMEAR  05/22/2019  . TETANUS/TDAP  08/12/2021    The following portions of the patient's history were reviewed and updated as appropriate: allergies, current medications, past family history, past medical history, past social history, past surgical history and problem list.  Review of Systems Pertinent items noted in HPI and remainder of comprehensive ROS otherwise negative.   Objective:    BP 115/78   Pulse 72   Ht 5\' 7"  (1.702 m)   Wt 188 lb (85.3 kg)   BMI 29.44 kg/m  General appearance: alert, cooperative and appears stated age Head: Normocephalic, without obvious abnormality, atraumatic Eyes: conjunctivae/corneas clear. PERRL, EOM's intact. Fundi benign. Ears: normal TM's and external ear canals both ears Nose: Nares normal. Septum midline. Mucosa normal. No drainage or sinus tenderness. Throat: lips, mucosa, and tongue normal; teeth and gums normal Neck: no  adenopathy, no carotid bruit, no JVD, supple, symmetrical, trachea midline and thyroid not enlarged, symmetric, no tenderness/mass/nodules Back: symmetric, no curvature. ROM normal. No CVA tenderness. Lungs: clear to auscultation bilaterally Heart: regular rate and rhythm, S1, S2 normal, no murmur, click, rub or gallop Abdomen: soft, non-tender; bowel sounds normal; no masses,  no organomegaly Extremities: extremities normal, atraumatic, no cyanosis or edema Pulses: 2+ and symmetric Skin: Skin color, texture, turgor normal. No rashes or lesions Lymph nodes: Cervical, supraclavicular, and axillary nodes normal. Neurologic: Alert and oriented X 3, normal strength and tone. Normal symmetric reflexes. Normal coordination and gait    Assessment:    Healthy female exam.      Plan:  Marland KitchenMarland KitchenJinan was seen today for annual exam.  Diagnoses and all orders for this visit:  Routine physical examination -     Lipid panel -     COMPLETE METABOLIC PANEL WITH GFR -     TSH  Abnormal weight gain -     buPROPion (WELLBUTRIN XL) 150 MG 24 hr tablet; Take 1 tablet (150 mg total) by mouth every morning.  Screening for lipid disorders -     Lipid panel  Hypertriglyceridemia -     Lipid panel  Screening for diabetes mellitus -     COMPLETE METABOLIC PANEL WITH GFR  Inattention -     buPROPion (WELLBUTRIN XL) 150 MG 24 hr tablet; Take 1 tablet (  150 mg total) by mouth every morning.   Discussed 1500 calorie diet.  Follow up in 2 months with weight.  Discussed side effects of wellbutrin.    See After Visit Summary for Counseling Recommendations

## 2016-10-26 ENCOUNTER — Encounter: Payer: Self-pay | Admitting: Physician Assistant

## 2016-10-26 DIAGNOSIS — E785 Hyperlipidemia, unspecified: Secondary | ICD-10-CM | POA: Insufficient documentation

## 2016-10-26 LAB — COMPLETE METABOLIC PANEL WITH GFR
ALBUMIN: 4.4 g/dL (ref 3.6–5.1)
ALK PHOS: 49 U/L (ref 33–115)
ALT: 75 U/L — AB (ref 6–29)
AST: 42 U/L — ABNORMAL HIGH (ref 10–30)
BUN: 15 mg/dL (ref 7–25)
CALCIUM: 9.8 mg/dL (ref 8.6–10.2)
CO2: 25 mmol/L (ref 20–31)
CREATININE: 1 mg/dL (ref 0.50–1.10)
Chloride: 102 mmol/L (ref 98–110)
GFR, EST AFRICAN AMERICAN: 83 mL/min (ref >=60)
GFR, Est Non African American: 72 mL/min (ref >=60)
Glucose, Bld: 90 mg/dL (ref 65–99)
Potassium: 5.2 mmol/L (ref 3.5–5.3)
Sodium: 138 mmol/L (ref 135–146)
TOTAL PROTEIN: 7.5 g/dL (ref 6.1–8.1)
Total Bilirubin: 0.7 mg/dL (ref 0.2–1.2)

## 2016-10-26 LAB — LIPID PANEL
CHOLESTEROL: 249 mg/dL — AB (ref <200)
HDL: 76 mg/dL (ref >50)
LDL Cholesterol: 150 mg/dL — ABNORMAL HIGH
TRIGLYCERIDES: 115 mg/dL (ref <150)
Total CHOL/HDL Ratio: 3.3 Ratio (ref <5.0)
VLDL: 23 mg/dL (ref <30)

## 2016-10-26 LAB — TSH: TSH: 2.12 mIU/L

## 2016-10-26 NOTE — Progress Notes (Signed)
Call pt: LDL went up 60 points from a year ago. Need to watch high fatty foods. Will recheck in 1 year.  Thyroid looks good.  Liver enzymes are a little elevated. Stop any tylenol/alcohol and recheck in 2 weeks.

## 2017-01-15 ENCOUNTER — Other Ambulatory Visit: Payer: Self-pay | Admitting: Physician Assistant

## 2017-01-15 DIAGNOSIS — R41840 Attention and concentration deficit: Secondary | ICD-10-CM

## 2017-01-15 DIAGNOSIS — R635 Abnormal weight gain: Secondary | ICD-10-CM

## 2017-01-21 ENCOUNTER — Ambulatory Visit: Payer: Self-pay | Admitting: Physician Assistant

## 2017-04-13 ENCOUNTER — Other Ambulatory Visit: Payer: Self-pay | Admitting: Osteopathic Medicine

## 2017-04-13 DIAGNOSIS — R635 Abnormal weight gain: Secondary | ICD-10-CM

## 2017-04-13 DIAGNOSIS — R41840 Attention and concentration deficit: Secondary | ICD-10-CM

## 2017-04-22 ENCOUNTER — Ambulatory Visit: Payer: 59 | Admitting: Physician Assistant

## 2017-05-08 ENCOUNTER — Ambulatory Visit (INDEPENDENT_AMBULATORY_CARE_PROVIDER_SITE_OTHER): Payer: 59 | Admitting: Physician Assistant

## 2017-05-08 ENCOUNTER — Encounter: Payer: Self-pay | Admitting: Physician Assistant

## 2017-05-08 VITALS — BP 109/74 | HR 71 | Ht 67.0 in | Wt 196.0 lb

## 2017-05-08 DIAGNOSIS — E669 Obesity, unspecified: Secondary | ICD-10-CM | POA: Diagnosis not present

## 2017-05-08 MED ORDER — BUPROPION HCL ER (XL) 300 MG PO TB24: 300.0000 mg | ORAL_TABLET | Freq: Every day | ORAL | 1 refills | Status: DC

## 2017-05-08 MED ORDER — LIRAGLUTIDE -WEIGHT MANAGEMENT 18 MG/3ML ~~LOC~~ SOPN: 0.6000 mg | PEN_INJECTOR | Freq: Every day | SUBCUTANEOUS | 1 refills | Status: DC

## 2017-05-08 NOTE — Progress Notes (Signed)
   Subjective:    Patient ID: April Jarvis, female    DOB: 1981-08-12, 36 y.o.   MRN: [id number]  HPI  Pt is a 36 yo female who presents to the clinic to discuss weight. We started wellbutrin and she did notice benefits with motivation/cravings but she has been out of it for one month. 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 She has gained weight from 188 to 196. She has tried 2 obese weight loss clinic and in the past lost weight. Once used phentermine and once HCG shots.    Review of Systems  All other systems reviewed and are negative.      Objective:   Physical Exam  Constitutional: She is oriented to person, place, and time. She appears well-developed and well-nourished.  Obesity.   HENT:  Head: Normocephalic and atraumatic.  Cardiovascular: Normal rate, regular rhythm and normal heart sounds.   Pulmonary/Chest: Effort normal and breath sounds normal. She has no wheezes.  Neurological: She is alert and oriented to person, place, and time.  Psychiatric: She has a normal mood and affect. Her behavior is normal.          Assessment & Plan:  Marland KitchenMarland KitchenDiagnoses and all orders for this visit:  Obesity (BMI 30-39.9) -     Liraglutide -Weight Management (SAXENDA) 18 MG/3ML SOPN; Inject 0.6 mg into the skin daily. For one week then increase by .6mg  weekly until reaches 3mg  daily.  Please include ultra fine needles 68mm -     buPROPion (WELLBUTRIN XL) 300 MG 24 hr tablet; Take 1 tablet (300 mg total) by mouth daily.  start counting calories. Phentermine has been tried since it initally worked with no benefit.  Will start saxenda. Discussed taper up to 3mg .  Discussed side effects. Pt does not have family hx of thyroid tumors.  Restart wellbutrin.  Start back with exercise.  Follow up in 2 months.

## 2017-05-08 NOTE — Patient Instructions (Signed)
Liraglutide injection (Weight Management) What is this medicine? LIRAGLUTIDE (LIR a GLOO tide) is used with a reduced calorie diet and exercise to help you lose weight. This medicine may be used for other purposes; ask your health care provider or pharmacist if you have questions. COMMON BRAND NAME(S): Saxenda What should I tell my health care provider before I take this medicine? They need to know if you have any of these conditions: -endocrine tumors (MEN 2) or if someone in your family had these tumors -gallbladder disease -high cholesterol -history of alcohol abuse problem -history of pancreatitis -kidney disease or if you are on dialysis -liver disease -previous swelling of the tongue, face, or lips with difficulty breathing, difficulty swallowing, hoarseness, or tightening of the throat -stomach problems -suicidal thoughts, plans, or attempt; a previous suicide attempt by you or a family member -thyroid cancer or if someone in your family had thyroid cancer -an unusual or allergic reaction to liraglutide, other medicines, foods, dyes, or preservatives -pregnant or trying to get pregnant -breast-feeding How should I use this medicine? This medicine is for injection under the skin of your upper leg, stomach area, or upper arm. You will be taught how to prepare and give this medicine. Use exactly as directed. Take your medicine at regular intervals. Do not take it more often than directed. It is important that you put your used needles and syringes in a special sharps container. Do not put them in a trash can. If you do not have a sharps container, call your pharmacist or healthcare provider to get one. A special MedGuide will be given to you by the pharmacist with each prescription and refill. Be sure to read this information carefully each time. Talk to your pediatrician regarding the use of this medicine in children. Special care may be needed. Overdosage: If you think you have taken  too much of this medicine contact a poison control center or emergency room at once. NOTE: This medicine is only for you. Do not share this medicine with others. What if I miss a dose? If you miss a dose, take it as soon as you can. If it is almost time for your next dose, take only that dose. Do not take double or extra doses. If you miss your dose for 3 days or more, call your doctor or health care professional to talk about how to restart this medicine. What may interact with this medicine? -insulin and other medicines for diabetes This list may not describe all possible interactions. Give your health care provider a list of all the medicines, herbs, non-prescription drugs, or dietary supplements you use. Also tell them if you smoke, drink alcohol, or use illegal drugs. Some items may interact with your medicine. What should I watch for while using this medicine? Visit your doctor or health care professional for regular checks on your progress. This medicine is intended to be used in addition to a healthy diet and appropriate exercise. The best results are achieved this way. Do not increase or in any way change your dose without consulting your doctor or health care professional. Drink plenty of fluids while taking this medicine. Check with your doctor or health care professional if you get an attack of severe diarrhea, nausea, and vomiting. The loss of too much body fluid can make it dangerous for you to take this medicine. This medicine may affect blood sugar levels. If you have diabetes, check with your doctor or health care professional before you change your diet  or the dose of your diabetic medicine. Patients and their families should watch out for worsening depression or thoughts of suicide. Also watch out for sudden changes in feelings such as feeling anxious, agitated, panicky, irritable, hostile, aggressive, impulsive, severely restless, overly excited and hyperactive, or not being able to  sleep. If this happens, especially at the beginning of treatment or after a change in dose, call your health care professional. What side effects may I notice from receiving this medicine? Side effects that you should report to your doctor or health care professional as soon as possible: -allergic reactions like skin rash, itching or hives, swelling of the face, lips, or tongue -breathing problems -diarrhea that continues or is severe -lump or swelling on the neck -severe nausea -signs and symptoms of infection like fever or chills; cough; sore throat; pain or trouble passing urine -signs and symptoms of low blood sugar such as feeling anxious, confusion, dizziness, increased hunger, unusually weak or tired, sweating, shakiness, cold, irritable, headache, blurred vision, fast heartbeat, loss of consciousness -signs and symptoms of kidney injury like trouble passing urine or change in the amount of urine -trouble swallowing -unusual stomach upset or pain -vomiting Side effects that usually do not require medical attention (report to your doctor or health care professional if they continue or are bothersome): -constipation -decreased appetite -diarrhea -fatigue -headache -nausea -pain, redness, or irritation at site where injected -stomach upset -stuffy or runny nose This list may not describe all possible side effects. Call your doctor for medical advice about side effects. You may report side effects to FDA at 1-800-FDA-1088. Where should I keep my medicine? Keep out of the reach of children. Store unopened pen in a refrigerator between 2 and 8 degrees C (36 and 46 degrees F). Do not freeze or use if the medicine has been frozen. Protect from light and excessive heat. After you first use the pen, it can be stored at room temperature between 15 and 30 degrees C (59 and 86 degrees F) or in a refrigerator. Throw away your used pen after 30 days or after the expiration date, whichever comes  first. Do not store your pen with the needle attached. If the needle is left on, medicine may leak from the pen. NOTE: This sheet is a summary. It may not cover all possible information. If you have questions about this medicine, talk to your doctor, pharmacist, or health care provider.  2018 Elsevier/Gold Standard (2016-12-20 14:41:37)  

## 2017-05-14 ENCOUNTER — Encounter: Payer: Self-pay | Admitting: Physician Assistant

## 2017-05-14 MED ORDER — PEN NEEDLES 32G X 6 MM MISC: 1.0000 | Freq: Every day | 1 refills | Status: DC

## 2017-06-27 LAB — HM PAP SMEAR: HM Pap smear: NEGATIVE

## 2017-07-08 ENCOUNTER — Ambulatory Visit: Payer: 59 | Admitting: Physician Assistant

## 2017-07-08 ENCOUNTER — Encounter: Payer: Self-pay | Admitting: Family Medicine

## 2017-07-08 ENCOUNTER — Ambulatory Visit (INDEPENDENT_AMBULATORY_CARE_PROVIDER_SITE_OTHER): Payer: 59 | Admitting: Family Medicine

## 2017-07-08 VITALS — BP 125/76 | HR 68 | Ht 67.0 in | Wt 179.0 lb

## 2017-07-08 DIAGNOSIS — E669 Obesity, unspecified: Secondary | ICD-10-CM | POA: Diagnosis not present

## 2017-07-08 DIAGNOSIS — R635 Abnormal weight gain: Secondary | ICD-10-CM

## 2017-07-08 DIAGNOSIS — Z6828 Body mass index (BMI) 28.0-28.9, adult: Secondary | ICD-10-CM | POA: Diagnosis not present

## 2017-07-08 DIAGNOSIS — R41840 Attention and concentration deficit: Secondary | ICD-10-CM | POA: Diagnosis not present

## 2017-07-08 MED ORDER — LIRAGLUTIDE -WEIGHT MANAGEMENT 18 MG/3ML ~~LOC~~ SOPN: 3.0000 mg | PEN_INJECTOR | Freq: Every day | SUBCUTANEOUS | 1 refills | Status: DC

## 2017-07-08 NOTE — Progress Notes (Signed)
   Subjective:    Patient ID: April Jarvis, female    DOB: 20-Jan-1981, 36 y.o.   MRN: [id number]  HPI 36 year old female is here today to follow-up for weight loss. She had previously tried to weight loss clinics in the past and actually been on phentermine and hCG shots. She saw her PCP back in May.  They decided on Saxenda.  Has lost 20 lbs in 2 months for Saxenda. She was up to 3mg  dose after about 1.5 weeks. She hasn't experienced any nausea. She has quit back on portions of food. She is not following a specific diet or calorie counting. She has been running and doing yoga 2-3 times a week.    Inattention - she is doing well on 300mg  of wellbutrin. Says it is really helping her sxs.   Review of Systems     Objective:   Physical Exam  Constitutional: She is oriented to person, place, and time. She appears well-developed and well-nourished.  HENT:  Head: Normocephalic and atraumatic.  Cardiovascular: Normal rate, regular rhythm and normal heart sounds.   Pulmonary/Chest: Effort normal and breath sounds normal.  Neurological: She is alert and oriented to person, place, and time.  Skin: Skin is warm and dry.  Psychiatric: She has a normal mood and affect. Her behavior is normal.          Assessment & Plan:  Abnormal weight gain/BMI 28 - continue Saxenda. She is currently paying $1000 per month. Encouraged her to call [facility]und for cash price. Check Good [facility]. She already has coupn for $200 off.    Diet: drink lots of water, no soda. Portion control. Not counting calories at this time Exercise Yoga/running 2-3 days per week. Shoot for 3-4 days per week Medication:Saxenda, can try decreasing dose to 2.2 mg to see if just as effective but will save money.  Changed Rx to 5 pens.  F/U: 2 months.    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

## 2017-07-13 ENCOUNTER — Other Ambulatory Visit: Payer: Self-pay | Admitting: Family Medicine

## 2017-07-13 DIAGNOSIS — E669 Obesity, unspecified: Secondary | ICD-10-CM

## 2017-08-21 ENCOUNTER — Encounter: Payer: Self-pay | Admitting: Physician Assistant

## 2017-09-23 ENCOUNTER — Telehealth: Payer: Self-pay | Admitting: *Deleted

## 2017-09-23 NOTE — Telephone Encounter (Signed)
Need current BMI, Wt and BP in order to proceed with prior auth for saxenda. Called and left message on patient's vm to call and schedule a nurse visit

## 2017-09-26 ENCOUNTER — Ambulatory Visit: Payer: 59

## 2017-10-14 ENCOUNTER — Ambulatory Visit (INDEPENDENT_AMBULATORY_CARE_PROVIDER_SITE_OTHER): Payer: 59 | Admitting: Physician Assistant

## 2017-10-14 VITALS — BP 138/77 | HR 66 | Ht 67.0 in | Wt 184.0 lb

## 2017-10-14 DIAGNOSIS — E669 Obesity, unspecified: Secondary | ICD-10-CM

## 2017-10-14 NOTE — Progress Notes (Signed)
Patient is here for blood pressure and weight check. Denies any trouble sleeping, palpitations, or any other medication problems. Pt reports this is required per her insurance to get her [employer] Rx PA approved. Pt has gained weight since last OV. Will route.

## 2017-10-15 NOTE — Progress Notes (Signed)
Spoke with Pt, she is currently using the Saxenda. She is using 2.4mg  daily.

## 2017-10-15 NOTE — Progress Notes (Signed)
Left VM to see if Pt is still using the Saxenda, or if she has been off the injections.

## 2017-10-17 NOTE — Progress Notes (Signed)
Is saxenda refill appropriate?

## 2017-11-08 ENCOUNTER — Other Ambulatory Visit: Payer: Self-pay | Admitting: Physician Assistant

## 2017-11-08 DIAGNOSIS — E669 Obesity, unspecified: Secondary | ICD-10-CM

## 2017-11-19 ENCOUNTER — Encounter: Payer: Self-pay | Admitting: Physician Assistant

## 2017-12-04 ENCOUNTER — Encounter: Payer: Self-pay | Admitting: Physician Assistant

## 2017-12-04 ENCOUNTER — Ambulatory Visit (INDEPENDENT_AMBULATORY_CARE_PROVIDER_SITE_OTHER): Payer: 59 | Admitting: Physician Assistant

## 2017-12-04 VITALS — BP 133/82 | HR 65 | Ht 67.0 in | Wt 192.0 lb

## 2017-12-04 DIAGNOSIS — Z13220 Encounter for screening for lipoid disorders: Secondary | ICD-10-CM | POA: Diagnosis not present

## 2017-12-04 DIAGNOSIS — E669 Obesity, unspecified: Secondary | ICD-10-CM | POA: Diagnosis not present

## 2017-12-04 DIAGNOSIS — Z131 Encounter for screening for diabetes mellitus: Secondary | ICD-10-CM | POA: Diagnosis not present

## 2017-12-04 DIAGNOSIS — E7800 Pure hypercholesterolemia, unspecified: Secondary | ICD-10-CM

## 2017-12-04 DIAGNOSIS — Z0000 Encounter for general adult medical examination without abnormal findings: Secondary | ICD-10-CM

## 2017-12-04 DIAGNOSIS — M7712 Lateral epicondylitis, left elbow: Secondary | ICD-10-CM

## 2017-12-04 MED ORDER — LIRAGLUTIDE -WEIGHT MANAGEMENT 18 MG/3ML ~~LOC~~ SOPN: 0.6000 mg | PEN_INJECTOR | Freq: Every day | SUBCUTANEOUS | 1 refills | Status: DC

## 2017-12-04 MED ORDER — BUPROPION HCL ER (XL) 300 MG PO TB24: 300.0000 mg | ORAL_TABLET | Freq: Every day | ORAL | 3 refills | Status: DC

## 2017-12-04 NOTE — Patient Instructions (Addendum)
Keeping You Healthy  Get These Tests 1. Blood Pressure- Have your blood pressure checked once a year by your health care provider.  Normal blood pressure is 120/80. 2. Weight- Have your body mass index (BMI) calculated to screen for obesity.  BMI is measure of body fat based on height and weight.  You can also calculate your own BMI at GravelBags.it. 3. Cholesterol- Have your cholesterol checked every 5 years starting at age 38 then yearly starting at age 69. 52. Chlamydia, HIV, and other sexually transmitted diseases- Get screened every year until age 74, then within three months of each new sexual provider. 5. Pap Test - Every 1-5 years; discuss with your health care provider. 6. Mammogram- Every 1-2 years starting at age 94--50  Take these medicines  Calcium with Vitamin D-Your body needs 1200 mg of Calcium each day and [phone] IU of Vitamin D daily.  Your body can only absorb 500 mg of Calcium at a time so Calcium must be taken in 2 or 3 divided doses throughout the day.  Multivitamin with folic acid- Once daily if it is possible for you to become pregnant.  Get these Immunizations  Gardasil-Series of three doses; prevents HPV related illness such as genital warts and cervical cancer.  Menactra-Single dose; prevents meningitis.  Tetanus shot- Every 10 years.  Flu shot-Every year.  Take these steps 1. Do not smoke-Your healthcare provider can help you quit.  For tips on how to quit go to www.smokefree.gov or call 1-800 QUITNOW. 2. Be physically active- Exercise 5 days a week for at least 30 minutes.  If you are not already physically active, start slow and gradually work up to 30 minutes of moderate physical activity.  Examples of moderate activity include walking briskly, dancing, swimming, bicycling, etc. 3. Breast Cancer- A self breast exam every month is important for early detection of breast cancer.  For more information and instruction on self breast exams, ask your  healthcare provider or https://www.patel.info/. 4. Eat a healthy diet- Eat a variety of healthy foods such as fruits, vegetables, whole grains, low fat milk, low fat cheeses, yogurt, lean meats, poultry and fish, beans, nuts, tofu, etc.  For more information go to www. Thenutritionsource.org 5. Drink alcohol in moderation- Limit alcohol intake to one drink or less per day. Never drink and drive. 6. Depression- Your emotional health is as important as your physical health.  If you're feeling down or losing interest in things you normally enjoy please talk to your healthcare provider about being screened for depression. 7. Dental visit- Brush and floss your teeth twice daily; visit your dentist twice a year. 8. Eye doctor- Get an eye exam at least every 2 years. 9. Helmet use- Always wear a helmet when riding a bicycle, motorcycle, rollerblading or skateboarding. 61. Safe sex- If you may be exposed to sexually transmitted infections, use a condom. 11. Seat belts- Seat belts can save your live; always wear one. 12. Smoke/Carbon Monoxide detectors- These detectors need to be installed on the appropriate level of your home. Replace batteries at least once a year. 13. Skin cancer- When out in the sun please cover up and use sunscreen 15 SPF or higher. 14. Violence- If anyone is threatening or hurting you, please tell your healthcare provider.        Tennis Elbow Rehab Ask your health care provider which exercises are safe for you. Do exercises exactly as told by your health care provider and adjust them as directed. It is  normal to feel mild stretching, pulling, tightness, or discomfort as you do these exercises, but you should stop right away if you feel sudden pain or your pain gets worse. Do not begin these exercises until told by your health care provider. Stretching and range of motion exercises These exercises warm up your muscles and joints and improve the movement and  flexibility of your elbow. These exercises also help to relieve pain, numbness, and tingling. Exercise A: Wrist extensor stretch 1. Extend your left / right elbow with your fingers pointing down. 2. Gently pull the palm of your left / right hand toward you until you feel a gentle stretch on the top of your forearm. 3. To increase the stretch, push your left / right hand toward the outer edge or pinkie side of your forearm. 4. Hold this position for __________ seconds. Repeat __________ times. Complete this exercise __________ times a day. If directed by your health care provider, repeat this stretch except do it with a bent elbow this time. Exercise B: Wrist flexor stretch  7. Extend your left / right elbow and turn your palm upward. 8. Gently pull your left / right palm and fingertips back so your wrist extends and your fingers point more toward the ground. 9. You should feel a gentle stretch on the inside of your forearm. 10. Hold this position for __________ seconds. Repeat __________ times. Complete this exercise __________ times a day. If directed by your health care provider, repeat this stretch except do it with a bent elbow this time. Strengthening exercises These exercises build strength and endurance in your elbow. Endurance is the ability to use your muscles for a long time, even after they get tired. Exercise C: Wrist extensors  3. Sit with your left / right forearm palm-down and fully supported on a table or countertop. Your elbow should be resting below the height of your shoulder. 4. Let your left / right wrist extend over the edge of the surface. 5. Loosely hold a __________ weight or a piece of rubber exercise band or tubing in your left / right hand. Slowly curl your left / right hand up toward your forearm. If you are using band or tubing, hold the band or tubing in place with your other hand to provide resistance. 6. Hold this position for __________ seconds. 7. Slowly  return to the starting position. Repeat __________ times. Complete this exercise __________ times a day. Exercise D: Radial deviators  5. Stand with a __________ weight in your left / righthand. Or, sit while holding a rubber exercise band or tubing with your other arm supported on a table or countertop. Position your hand so your thumb is on top. 6. Raise your hand upward in front of you so your thumb travels toward your forearm, or pull up on the rubber tubing. 7. Hold this position for __________ seconds. 8. Slowly return to the starting position. Repeat __________ times. Complete this exercise __________ times a day. Exercise E: Eccentric wrist extensors 1. Sit with your left / right forearm palm-down and fully supported on a table or countertop. Your elbow should be resting below the height of your shoulder. 2. If told by your health care provider, hold a __________ weight in your hand. 3. Let your left / right wrist extend over the edge of the surface. 4. Use your other hand to lift up your left / right hand toward your forearm. Keep your forearm on the table. 5. Using only the muscles in  your left / right hand, slowly lower your hand back down to the starting position. Repeat __________ times. Complete this exercise __________ times a day. This information is not intended to replace advice given to you by your health care provider. Make sure you discuss any questions you have with your health care provider. Document Released: 12/03/2005 Document Revised: 08/08/2016 Document Reviewed: 09/01/2015 Elsevier Interactive Patient Education  [employer].

## 2017-12-04 NOTE — Progress Notes (Addendum)
Subjective:     April Jarvis is a 35 y.o. female and is here for a comprehensive physical exam. The patient reports problems - weight gain after going off Saxenda. She went off the [place] because her insurance did not cover it and it was too expensive. 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 She had good results with Saxenda when she was taking it and had lost almost 20 lbs from 196 lbs to 179 lbs, but is now back to 192 lbs. She reports no nausea with using Saxenda. She is not working out as much as usual because her new job requires a lot of travel. She is still taking Wellbutrin and is still tolerating the medication. Denies medication side effects.  She takes a multivitamin daily.  Patient reports left lateral elbow pain. Pain is worse with activity and heavy lifting. It is tender to the touch. She has noticed that when she rests it gets better. Denies radiation of pain down her arm.  Social History   Socioeconomic History  . Marital status: Married    Spouse name: Not on file  . Number of children: Not on file  . Years of education: Not on file  . Highest education level: Not on file  Social Needs  . Financial resource strain: Not on file  . Food insecurity - worry: Not on file  . Food insecurity - inability: Not on file  . Transportation needs - medical: Not on file  . Transportation needs - non-medical: Not on file  Occupational History  . Not on file  Tobacco Use  . Smoking status: Current Some Day Smoker    Packs/day: 0.10    Types: Cigarettes    Last attempt to quit: 07/17/2008    Years since quitting: 9.3  . Smokeless tobacco: Never Used  Substance and Sexual Activity  . Alcohol use: Yes    Alcohol/week: 1.0 oz    Types: 2 Standard drinks or equivalent per week  . Drug use: No  . Sexual activity: Not on file  Other Topics Concern  . Not on file  Social History Narrative  . Not on file   Health Maintenance  Topic Date Due  . INFLUENZA  VACCINE  12/04/2018 (Originally 07/17/2017)  . HIV Screening  12/04/2018 (Originally 04/17/1994)  . PAP SMEAR  06/27/2020  . TETANUS/TDAP  08/12/2021    The following portions of the patient's history were reviewed and updated as appropriate: allergies, current medications, past family history, past medical history, past social history, past surgical history and problem list.  Review of Systems Pertinent items noted in HPI and remainder of comprehensive ROS otherwise negative.   Objective:    BP 133/82   Pulse 65   Ht 5\' 7"  (1.702 m)   Wt 192 lb (87.1 kg)   BMI 30.07 kg/m  General appearance: alert, cooperative and no distress Head: Normocephalic, without obvious abnormality, atraumatic Eyes: conjunctivae/corneas clear. PERRL, EOM's intact. Fundi benign. Ears: normal TM's and external ear canals both ears Nose: Nares normal. Septum midline. Mucosa normal. No drainage or sinus tenderness. Throat: lips, mucosa, and tongue normal; teeth and gums normal Neck: no adenopathy, no carotid bruit, no JVD, supple, symmetrical, trachea midline and thyroid not enlarged, symmetric, no tenderness/mass/nodules Back: symmetric, no curvature. ROM normal. No CVA tenderness. Lungs: clear to auscultation bilaterally Heart: regular rate and rhythm, S1, S2 normal, no murmur, click, rub or gallop Abdomen: soft, non-tender; bowel sounds normal; no masses,  no organomegaly Extremities: extremities  normal, atraumatic, no cyanosis or edema. Tenderness to palpation of the lateral epicondyle of her left elbow. ROM of elbow is full in flexion and extension. Pulses: 2+ and symmetric Skin: Skin color, texture, turgor normal. No rashes or lesions Lymph nodes: Cervical, supraclavicular, and axillary nodes normal. Neurologic: Alert and oriented X 3, normal strength and tone. Normal symmetric reflexes. Normal coordination and gait    Assessment:    Healthy female exam.     Plan:  Marland KitchenMarland KitchenKushi was seen today for annual  exam and obesity.  Diagnoses and all orders for this visit:  Routine physical examination -     Lipid Panel w/reflex Direct LDL -     COMPLETE METABOLIC PANEL WITH GFR  Obesity (BMI 30-39.9) -     Liraglutide -Weight Management (SAXENDA) 18 MG/3ML SOPN; Inject 0.6 mg into the skin daily. For one week then increase by .6mg  weekly until reaches 3mg  daily.  Please include ultra fine needles 31mm -     buPROPion (WELLBUTRIN XL) 300 MG 24 hr tablet; Take 1 tablet (300 mg total) by mouth daily.  Screening for lipid disorders  Elevated LDL cholesterol level -     Lipid Panel w/reflex Direct LDL  Screening for diabetes mellitus -     COMPLETE METABOLIC PANEL WITH GFR  Lateral epicondylitis of left elbow  .Marland Kitchen Depression screen PHQ 2/9 02/23/2014  Decreased Interest 0  Down, Depressed, Hopeless 0  PHQ - 2 Score 0    Obesity - Restart saxenda. Advised patient that she needs to taper up each time she restarts Saxenda due to nausea as a side effect. - Encouraged diet and exercise in addition to [place]. -..Discussed low carb diet with 1500 calories and 80g of protein.  Exercising at least 150 minutes a week.  My Fitness Pal could be a Microbiologist.   - Continue Wellbutrin -follow up in 3 months.   Left lateral epicondylitis - Advised conservative measures such as bracing, ice, oral or topical NSAIDs. - Discussed that if she does not experience relief of symptoms with conservative measures she may need to follow-up with sports medicine for injections.  Routine physical exam - Recheck fasting labs after being on Saxenda.   See After Visit Summary for Counseling Recommendations

## 2017-12-06 ENCOUNTER — Encounter: Payer: Self-pay | Admitting: Physician Assistant

## 2017-12-06 ENCOUNTER — Telehealth: Payer: Self-pay | Admitting: *Deleted

## 2017-12-06 NOTE — Telephone Encounter (Signed)
Pre Authorization was started over the phone with insurance company for [place]. [employer]gy was approved through the insurance. Ref number [id number]. Patient notified through mychart. Pharmacy notified via vm

## 2017-12-17 ENCOUNTER — Encounter: Payer: Self-pay | Admitting: Physician Assistant

## 2018-02-11 ENCOUNTER — Encounter: Payer: Self-pay | Admitting: Physician Assistant

## 2018-02-17 MED ORDER — TYPHOID VACCINE PO CPDR: 1.0000 | DELAYED_RELEASE_CAPSULE | ORAL | 0 refills | Status: DC

## 2018-04-07 ENCOUNTER — Other Ambulatory Visit: Payer: Self-pay | Admitting: Family Medicine

## 2018-04-07 DIAGNOSIS — E669 Obesity, unspecified: Secondary | ICD-10-CM

## 2018-04-08 ENCOUNTER — Encounter: Payer: Self-pay | Admitting: Physician Assistant

## 2018-04-09 ENCOUNTER — Encounter: Payer: Self-pay | Admitting: Physician Assistant

## 2018-04-09 ENCOUNTER — Other Ambulatory Visit: Payer: Self-pay

## 2018-04-09 MED ORDER — PEN NEEDLES 32G X 6 MM MISC: 1.0000 | Freq: Every day | 0 refills | Status: DC

## 2018-04-15 ENCOUNTER — Encounter: Payer: Self-pay | Admitting: Physician Assistant

## 2018-04-15 ENCOUNTER — Ambulatory Visit (INDEPENDENT_AMBULATORY_CARE_PROVIDER_SITE_OTHER): Payer: 59 | Admitting: Physician Assistant

## 2018-04-15 VITALS — BP 118/72 | HR 80 | Ht 67.0 in | Wt 175.0 lb

## 2018-04-15 DIAGNOSIS — Z131 Encounter for screening for diabetes mellitus: Secondary | ICD-10-CM

## 2018-04-15 DIAGNOSIS — E663 Overweight: Secondary | ICD-10-CM

## 2018-04-15 DIAGNOSIS — Z13220 Encounter for screening for lipoid disorders: Secondary | ICD-10-CM

## 2018-04-15 MED ORDER — LIRAGLUTIDE -WEIGHT MANAGEMENT 18 MG/3ML ~~LOC~~ SOPN: 3.0000 mg | PEN_INJECTOR | Freq: Every day | SUBCUTANEOUS | 5 refills | Status: DC

## 2018-04-15 MED ORDER — BUPROPION HCL ER (XL) 300 MG PO TB24: 300.0000 mg | ORAL_TABLET | Freq: Every day | ORAL | 3 refills | Status: DC

## 2018-04-15 NOTE — Progress Notes (Signed)
   Subjective:    Patient ID: April Jarvis, female    DOB: 25-Dec-1980, 37 y.o.   MRN: [id number]  HPI  Patient is a 37 year old female who presents to the clinic for follow-up on weight medication Saxenda.  Her starting weight was 192.  She is down 17 pounds.  She has 10 more pounds to get to her goal.  She feels like medication is been very effective.  She feels like it is curbed her appetite but not created any stimulatory effects.  She is been very busy with work and not exercising like she should.  She does do yoga once or twice a week.  She denies any nausea or other side effects of Saxenda.  She wishes to continue.  She is mostly decreasing her portion sizes not really changing what she is eating.  .. Active Ambulatory Problems    Diagnosis Date Noted  . 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 NUMBNESS 10/21/2008  . PERONEAL NEUROPATHY 11/04/2008  . Overweight (BMI 25.0-29.9) 02/28/2015  . Hypertriglyceridemia 02/28/2015  . Inattention 02/28/2015  . Abnormal weight gain 04/01/2015  . Hyperlipidemia 10/26/2016  . Lateral epicondylitis of left elbow 12/04/2017  . Elevated LDL cholesterol level 12/04/2017  . Obesity (BMI 30-39.9) 12/04/2017   Resolved Ambulatory Problems    Diagnosis Date Noted  . Ankle sprain 02/28/2015   No Additional Past Medical History      Review of Systems  All other systems reviewed and are negative.      Objective:   Physical Exam  Constitutional: She is oriented to person, place, and time. She appears well-developed and well-nourished.  HENT:  Head: Normocephalic and atraumatic.  Cardiovascular: Normal rate and regular rhythm.  Pulmonary/Chest: Effort normal.  Neurological: She is alert and oriented to person, place, and time.  Psychiatric: She has a normal mood and affect. Her behavior is normal.          Assessment & Plan:  Marland KitchenMarland KitchenSomara was seen today for obesity.  Diagnoses and all orders for this visit:  Overweight  (BMI 25.0-29.9) -     buPROPion (WELLBUTRIN XL) 300 MG 24 hr tablet; Take 1 tablet (300 mg total) by mouth daily. -     Liraglutide -Weight Management (SAXENDA) 18 MG/3ML SOPN; Inject 3 mg into the skin daily.  Screening for diabetes mellitus -     COMPLETE METABOLIC PANEL WITH GFR  Screening for lipid disorders -     Lipid Panel w/reflex Direct LDL   Patient is out of the obese category and into the overweight category.  Patient is doing very well.  I did strongly encourage her to increase her exercise.  Saxenda refilled for 6 months.  Discussed the long-term safety and nature of this drug.  She did not get her fasting labs done at last visit.  I reordered these.

## 2018-04-16 ENCOUNTER — Encounter: Payer: Self-pay | Admitting: Physician Assistant

## 2018-04-18 ENCOUNTER — Encounter: Payer: Self-pay | Admitting: Physician Assistant

## 2018-04-18 ENCOUNTER — Telehealth: Payer: Self-pay | Admitting: Physician Assistant

## 2018-04-18 NOTE — Telephone Encounter (Signed)
Received fax from [facility] that Esparto was approved from 04/17/2018 through 04/18/2019. Pharmacy notified and forms sent to scan.   Reference ID: [id number].

## 2018-07-15 LAB — HM PAP SMEAR: HM Pap smear: NEGATIVE

## 2018-08-15 ENCOUNTER — Other Ambulatory Visit: Payer: Self-pay | Admitting: Physician Assistant

## 2018-09-09 ENCOUNTER — Ambulatory Visit (INDEPENDENT_AMBULATORY_CARE_PROVIDER_SITE_OTHER): Payer: 59 | Admitting: Physician Assistant

## 2018-09-09 ENCOUNTER — Encounter: Payer: Self-pay | Admitting: Physician Assistant

## 2018-09-09 VITALS — BP 132/88 | HR 68 | Ht 67.0 in | Wt 179.0 lb

## 2018-09-09 DIAGNOSIS — R0989 Other specified symptoms and signs involving the circulatory and respiratory systems: Secondary | ICD-10-CM | POA: Diagnosis not present

## 2018-09-09 DIAGNOSIS — J320 Chronic maxillary sinusitis: Secondary | ICD-10-CM

## 2018-09-09 MED ORDER — FLUTICASONE PROPIONATE 50 MCG/ACT NA SUSP: 2.0000 | Freq: Every day | NASAL | 6 refills | Status: DC

## 2018-09-09 MED ORDER — AMOXICILLIN-POT CLAVULANATE 500-125 MG PO TABS: 1.0000 | ORAL_TABLET | Freq: Two times a day (BID) | ORAL | 0 refills | Status: AC

## 2018-09-09 NOTE — Progress Notes (Signed)
   Subjective:    Patient ID: April Jarvis, female    DOB: 02-19-1981, 37 y.o.   MRN: [id number]  HPI Pt is a 37 yo female who presents to the clinic to discuss bilateral upper palate teeth pain off and on for the last 6 months. She recently went to the dentist thinking something was going on with her teeth but when they did xray showed bilateral maxillary sinusitis. Pt has noticed a lot of sniffling and intermittent teeth pain with eating "crunchy" things. She denies any fever, chills, ear pain, sinus pressure, cough, sOB, wheezing. She does feel "congested a lot".   .. Active Ambulatory Problems    Diagnosis Date Noted  . 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 NUMBNESS 10/21/2008  . PERONEAL NEUROPATHY 11/04/2008  . Overweight (BMI 25.0-29.9) 02/28/2015  . Hypertriglyceridemia 02/28/2015  . Inattention 02/28/2015  . Abnormal weight gain 04/01/2015  . Hyperlipidemia 10/26/2016  . Lateral epicondylitis of left elbow 12/04/2017  . Elevated LDL cholesterol level 12/04/2017  . Obesity (BMI 30-39.9) 12/04/2017   Resolved Ambulatory Problems    Diagnosis Date Noted  . Ankle sprain 02/28/2015   No Additional Past Medical History      Review of Systems  All other systems reviewed and are negative.      Objective:   Physical Exam  Constitutional: She is oriented to person, place, and time. She appears well-developed and well-nourished.  HENT:  Head: Normocephalic and atraumatic.  Right Ear: External ear normal.  Left Ear: External ear normal.  Nose: Nose normal.  Mouth/Throat: Oropharynx is clear and moist. No oropharyngeal exudate.  TM's clear.  No tenderness over sinuses to palpation.    Eyes: Conjunctivae are normal.  Neck: Normal range of motion. Neck supple.  Cardiovascular: Normal rate and regular rhythm.  Pulmonary/Chest: Effort normal and breath sounds normal. She has no wheezes.  Lymphadenopathy:    She has no cervical adenopathy.  Neurological: She  is alert and oriented to person, place, and time.  Psychiatric: She has a normal mood and affect. Her behavior is normal.          Assessment & Plan:  Marland KitchenMarland KitchenDiagnoses and all orders for this visit:  Chronic maxillary sinusitis -     amoxicillin-clavulanate (AUGMENTIN) 500-125 MG tablet; Take 1 tablet (500 mg total) by mouth 2 (two) times daily.  Chronic sniffling -     fluticasone (FLONASE) 50 MCG/ACT nasal spray; Place 2 sprays into both nostrils daily. -     amoxicillin-clavulanate (AUGMENTIN) 500-125 MG tablet; Take 1 tablet (500 mg total) by mouth 2 (two) times daily.   Based on symptoms for the last 6 months and xray from dentists will treat for chronic sinusitis which is 3-4 weeks. Added flonase daily. Follow up as needed and or if symptoms worsen or persist.

## 2018-09-09 NOTE — Patient Instructions (Signed)

## 2018-09-11 ENCOUNTER — Other Ambulatory Visit: Payer: Self-pay | Admitting: Physician Assistant

## 2018-09-11 DIAGNOSIS — E663 Overweight: Secondary | ICD-10-CM

## 2018-09-29 ENCOUNTER — Encounter: Payer: Self-pay | Admitting: Family Medicine

## 2018-09-29 ENCOUNTER — Ambulatory Visit (INDEPENDENT_AMBULATORY_CARE_PROVIDER_SITE_OTHER): Payer: 59 | Admitting: Family Medicine

## 2018-09-29 VITALS — BP 139/89 | HR 74 | Ht 67.0 in | Wt 182.0 lb

## 2018-09-29 DIAGNOSIS — J029 Acute pharyngitis, unspecified: Secondary | ICD-10-CM

## 2018-09-29 DIAGNOSIS — R42 Dizziness and giddiness: Secondary | ICD-10-CM

## 2018-09-29 LAB — CBC WITH DIFFERENTIAL/PLATELET
Basophils Absolute: 80 cells/uL (ref 0–200)
Basophils Relative: 1.6 %
EOS PCT: 4.2 %
Eosinophils Absolute: 210 cells/uL (ref 15–500)
HCT: 40.9 % (ref 35.0–45.0)
HEMOGLOBIN: 13.8 g/dL (ref 11.7–15.5)
Lymphs Abs: 2025 cells/uL (ref 850–3900)
MCH: 31.9 pg (ref 27.0–33.0)
MCHC: 33.7 g/dL (ref 32.0–36.0)
MCV: 94.5 fL (ref 80.0–100.0)
MONOS PCT: 11.3 %
MPV: 9.5 fL (ref 7.5–12.5)
Neutro Abs: 2120 cells/uL (ref 1500–7800)
Neutrophils Relative %: 42.4 %
Platelets: 303 10*3/uL (ref 140–400)
RBC: 4.33 10*6/uL (ref 3.80–5.10)
RDW: 11.4 % (ref 11.0–15.0)
TOTAL LYMPHOCYTE: 40.5 %
WBC mixed population: 565 cells/uL (ref 200–950)
WBC: 5 10*3/uL (ref 3.8–10.8)

## 2018-09-29 NOTE — Progress Notes (Signed)
Subjective:    Patient ID: April Jarvis, female    DOB: February 05, 1981, 37 y.o.   MRN: [id number]  HPI 37 year old female comes in today for sore throat.  Is actually currently being treated for chronic maxillary sinusitis.  She has been on Augmentin for a little over 2 weeks.  But about 3 days ago started to get some irritation and soreness in her throat.  She says today it is painful to gone.  She says yesterday she was driving in the morning and she had eaten breakfast and started to feel a little lightheaded and off.  It was enough that it bothered her that she turned around and went back home she did not feel safe to drive.  She says she still feels a little off and foggy brain this morning but not nearly as intense as it was yesterday.  Yesterday when she got home she did drink a ginger ale and says did feel a little better afterwards.  She did feel nauseated last night but denies any fevers chills or sweats or diarrhea.  She did have a little reflux and heartburn last night.  She really has not had any major nasal congestion but she is been using her Flonase.   Review of Systems  BP 139/89   Pulse 74   Ht 5\' 7"  (1.702 m)   Wt 182 lb (82.6 kg)   SpO2 100%   BMI 28.51 kg/m     No Known Allergies  History reviewed. No pertinent past medical history.  History reviewed. No pertinent surgical history.  Social History   Socioeconomic History  . Marital status: Married    Spouse name: Not on file  . Number of children: Not on file  . Years of education: Not on file  . Highest education level: Not on file  Occupational History  . Not on file  Social Needs  . Financial resource strain: Not on file  . Food insecurity:    Worry: Not on file    Inability: Not on file  . Transportation needs:    Medical: Not on file    Non-medical: Not on file  Tobacco Use  . Smoking status: Current Some Day Smoker    Packs/day: 0.10    Types: Cigarettes    Last attempt to quit: 07/17/2008   Years since quitting: 10.2  . Smokeless tobacco: Never Used  Substance and Sexual Activity  . Alcohol use: Yes    Alcohol/week: 2.0 standard drinks    Types: 2 Standard drinks or equivalent per week  . Drug use: No  . Sexual activity: Not on file  Lifestyle  . Physical activity:    Days per week: Not on file    Minutes per session: Not on file  . Stress: Not on file  Relationships  . Social connections:    Talks on phone: Not on file    Gets together: Not on file    Attends religious service: Not on file    Active member of club or organization: Not on file    Attends meetings of clubs or organizations: Not on file    Relationship status: Not on file  . Intimate partner violence:    Fear of current or ex partner: Not on file    Emotionally abused: Not on file    Physically abused: Not on file    Forced sexual activity: Not on file  Other Topics Concern  . Not on file  Social History Narrative  .  Not on file    Family History  Problem Relation Age of Onset  . Depression Mother   . Hyperlipidemia Father   . Pancreatic cancer Father   . Cancer Maternal Grandmother        breast  . Heart disease Maternal Grandfather 60       MI  . Pancreatic cancer Paternal Grandfather     Outpatient Encounter Medications as of 09/29/2018  Medication Sig  . amoxicillin-clavulanate (AUGMENTIN) 500-125 MG tablet Take 1 tablet (500 mg total) by mouth 2 (two) times daily.  . BD PEN NEEDLE MICRO U/F 32G X 6 MM MISC USE TO INJECT SAXENDA DAILY.  Marland Kitchen buPROPion (WELLBUTRIN XL) 300 MG 24 hr tablet Take 1 tablet (300 mg total) by mouth daily.  . fluticasone (FLONASE) 50 MCG/ACT nasal spray Place 2 sprays into both nostrils daily.  Marland Kitchen SAXENDA 18 MG/3ML SOPN INJECT 3MG  INTO THE SKIN DAILY   No facility-administered encounter medications on file as of 09/29/2018.          Objective:   Physical Exam  Constitutional: She is oriented to person, place, and time. She appears well-developed and  well-nourished.  HENT:  Head: Normocephalic and atraumatic.  Right Ear: External ear normal.  Left Ear: External ear normal.  Nose: Nose normal.  Mouth/Throat: Oropharynx is clear and moist.  TMs and canals are clear.   Eyes: Pupils are equal, round, and reactive to light. Conjunctivae and EOM are normal.  Neck: Neck supple. No thyromegaly present.  Cardiovascular: Normal rate, regular rhythm and normal heart sounds.  Pulmonary/Chest: Effort normal and breath sounds normal. She has no wheezes.  Lymphadenopathy:    She has no cervical adenopathy.  Neurological: She is alert and oriented to person, place, and time.  Skin: Skin is warm and dry.  Psychiatric: She has a normal mood and affect.        Assessment & Plan:  ST with lightheaded.  -Unclear etiology at this point she is Artie been on Augmentin for a couple of weeks so it is unlikely to be strep throat.  Her sinuses are feeling better she is less congested and she is been using her Flonase so I do not think it is probably postnasal drip.  Consider that this could be a viral illness.  Versus possibly thrush since she has been on antibiotics for several weeks.  We did do a swab/KOH to check for yeast.  Also check a CBC just to make sure that were not missing something underlying and will call her back with results.  If everything is normal then we will give this a few more days and see if she improves on her own.  If not then she will let us know.

## 2018-10-01 LAB — FUNGAL STAIN
FUNGAL SMEAR:: NONE SEEN
MICRO NUMBER: 91233045
SPECIMEN QUALITY: ADEQUATE

## 2018-12-23 ENCOUNTER — Other Ambulatory Visit: Payer: Self-pay | Admitting: Physician Assistant

## 2018-12-23 DIAGNOSIS — E663 Overweight: Secondary | ICD-10-CM

## 2019-01-12 ENCOUNTER — Other Ambulatory Visit: Payer: Self-pay | Admitting: Physician Assistant

## 2019-01-23 ENCOUNTER — Other Ambulatory Visit: Payer: Self-pay | Admitting: Physician Assistant

## 2019-01-23 DIAGNOSIS — E663 Overweight: Secondary | ICD-10-CM

## 2019-01-30 ENCOUNTER — Other Ambulatory Visit: Payer: Self-pay | Admitting: Physician Assistant

## 2019-01-30 DIAGNOSIS — E663 Overweight: Secondary | ICD-10-CM

## 2019-02-27 ENCOUNTER — Other Ambulatory Visit: Payer: Self-pay | Admitting: Physician Assistant

## 2019-02-27 DIAGNOSIS — E663 Overweight: Secondary | ICD-10-CM

## 2019-03-23 ENCOUNTER — Telehealth: Payer: Self-pay | Admitting: Physician Assistant

## 2019-03-23 NOTE — Telephone Encounter (Signed)
Received fax from [facility] that Saxenda  was approved from 03/23/2019 through 03/22/2020. Pharmacy notified and forms sent to scan.

## 2019-03-23 NOTE — Telephone Encounter (Signed)
Received fax from [employer] that Saxenda requires a PA. Information has been sent to the insurance company. Awaiting determination.   

## 2019-03-28 ENCOUNTER — Other Ambulatory Visit: Payer: Self-pay | Admitting: Physician Assistant

## 2019-03-28 DIAGNOSIS — E663 Overweight: Secondary | ICD-10-CM

## 2019-04-28 ENCOUNTER — Other Ambulatory Visit: Payer: Self-pay | Admitting: Physician Assistant

## 2019-04-28 DIAGNOSIS — R0989 Other specified symptoms and signs involving the circulatory and respiratory systems: Secondary | ICD-10-CM

## 2019-05-02 ENCOUNTER — Other Ambulatory Visit: Payer: Self-pay | Admitting: Physician Assistant

## 2019-05-02 DIAGNOSIS — E663 Overweight: Secondary | ICD-10-CM

## 2019-05-09 ENCOUNTER — Other Ambulatory Visit: Payer: Self-pay | Admitting: Physician Assistant

## 2019-05-28 ENCOUNTER — Other Ambulatory Visit: Payer: Self-pay | Admitting: Physician Assistant

## 2019-05-28 DIAGNOSIS — E663 Overweight: Secondary | ICD-10-CM

## 2019-05-28 DIAGNOSIS — R0989 Other specified symptoms and signs involving the circulatory and respiratory systems: Secondary | ICD-10-CM

## 2019-07-21 ENCOUNTER — Telehealth: Payer: Self-pay | Admitting: Physician Assistant

## 2019-07-21 ENCOUNTER — Encounter: Payer: Self-pay | Admitting: Physician Assistant

## 2019-07-21 ENCOUNTER — Telehealth (INDEPENDENT_AMBULATORY_CARE_PROVIDER_SITE_OTHER): Payer: 59 | Admitting: Physician Assistant

## 2019-07-21 VITALS — BP 132/86 | HR 65 | Temp 98.5°F | Ht 66.0 in | Wt 191.0 lb

## 2019-07-21 DIAGNOSIS — Z13220 Encounter for screening for lipoid disorders: Secondary | ICD-10-CM | POA: Diagnosis not present

## 2019-07-21 DIAGNOSIS — Z1231 Encounter for screening mammogram for malignant neoplasm of breast: Secondary | ICD-10-CM

## 2019-07-21 DIAGNOSIS — Z131 Encounter for screening for diabetes mellitus: Secondary | ICD-10-CM

## 2019-07-21 DIAGNOSIS — E663 Overweight: Secondary | ICD-10-CM | POA: Diagnosis not present

## 2019-07-21 DIAGNOSIS — Z0000 Encounter for general adult medical examination without abnormal findings: Secondary | ICD-10-CM

## 2019-07-21 MED ORDER — BUPROPION HCL ER (XL) 300 MG PO TB24: 300.0000 mg | ORAL_TABLET | Freq: Every day | ORAL | 1 refills | Status: DC

## 2019-07-21 MED ORDER — LIRAGLUTIDE -WEIGHT MANAGEMENT 18 MG/3ML ~~LOC~~ SOPN: 3.0000 mg | PEN_INJECTOR | Freq: Every day | SUBCUTANEOUS | 1 refills | Status: DC

## 2019-07-21 NOTE — Telephone Encounter (Signed)
Also needs to titrate Saxenda. Will discuss with patient.

## 2019-07-21 NOTE — Progress Notes (Signed)
Subjective:     April Jarvis is a 38 y.o. female and is here for a comprehensive physical exam. The patient reports no problems.  Social History   Socioeconomic History  . Marital status: Married    Spouse name: Not on file  . Number of children: Not on file  . Years of education: Not on file  . Highest education level: Not on file  Occupational History  . Not on file  Social Needs  . Financial resource strain: Not on file  . Food insecurity    Worry: Not on file    Inability: Not on file  . Transportation needs    Medical: Not on file    Non-medical: Not on file  Tobacco Use  . Smoking status: Current Some Day Smoker    Packs/day: 0.10    Types: Cigarettes    Last attempt to quit: 07/17/2008    Years since quitting: 11.0  . Smokeless tobacco: Never Used  Substance and Sexual Activity  . Alcohol use: Yes    Alcohol/week: 2.0 standard drinks    Types: 2 Standard drinks or equivalent per week  . Drug use: No  . Sexual activity: Not on file  Lifestyle  . Physical activity    Days per week: Not on file    Minutes per session: Not on file  . Stress: Not on file  Relationships  . Social Herbalist on phone: Not on file    Gets together: Not on file    Attends religious service: Not on file    Active member of club or organization: Not on file    Attends meetings of clubs or organizations: Not on file    Relationship status: Not on file  . Intimate partner violence    Fear of current or ex partner: Not on file    Emotionally abused: Not on file    Physically abused: Not on file    Forced sexual activity: Not on file  Other Topics Concern  . Not on file  Social History Narrative  . Not on file   Health Maintenance  Topic Date Due  . HIV Screening  04/17/1994  . INFLUENZA VACCINE  07/18/2019  . PAP SMEAR-Modifier  06/27/2020  . TETANUS/TDAP  08/12/2021    The following portions of the patient's history were reviewed and updated as appropriate:  allergies, current medications, past family history, past medical history, past social history, past surgical history and problem list.  Review of Systems A comprehensive review of systems was negative.   Objective:    BP 132/86   Pulse 65   Temp 98.5 F (36.9 C) (Oral)   Ht 5\' 6"  (1.676 m)   Wt 191 lb (86.6 kg)   SpO2 100%   BMI 30.83 kg/m  General appearance: alert, cooperative, appears stated age and mildly obese Head: Normocephalic, without obvious abnormality, atraumatic Eyes: conjunctivae/corneas clear. PERRL, EOM's intact. Fundi benign. Ears: normal TM's and external ear canals both ears Nose: Nares normal. Septum midline. Mucosa normal. No drainage or sinus tenderness. Throat: lips, mucosa, and tongue normal; teeth and gums normal Neck: no adenopathy, no carotid bruit, no JVD, supple, symmetrical, trachea midline and thyroid not enlarged, symmetric, no tenderness/mass/nodules Back: symmetric, no curvature. ROM normal. No CVA tenderness. Lungs: clear to auscultation bilaterally Heart: regular rate and rhythm, S1, S2 normal, no murmur, click, rub or gallop Abdomen: soft, non-tender; bowel sounds normal; no masses,  no organomegaly Extremities: extremities normal, atraumatic, no  cyanosis or edema Pulses: 2+ and symmetric Skin: Skin color, texture, turgor normal. No rashes or lesions Lymph nodes: Cervical, supraclavicular, and axillary nodes normal. Neurologic: Alert and oriented X 3, normal strength and tone. Normal symmetric reflexes. Normal coordination and gait    Assessment:    Healthy female exam.     Plan:    Marland KitchenMarland KitchenPaulette was seen today for depression and annual exam.  Diagnoses and all orders for this visit:  Routine physical examination -     Lipid Panel w/reflex Direct LDL -     COMPLETE METABOLIC PANEL WITH GFR  Overweight (BMI 25.0-29.9) -     Liraglutide -Weight Management 18 MG/3ML SOPN; Inject 3 mg into the skin daily. -     buPROPion (WELLBUTRIN XL)  300 MG 24 hr tablet; Take 1 tablet (300 mg total) by mouth daily.  Screening for lipid disorders -     Lipid Panel w/reflex Direct LDL  Screening for diabetes mellitus -     COMPLETE METABOLIC PANEL WITH GFR   .Marland Kitchen Depression screen [facility] 2/9 07/21/2019 09/09/2018 12/04/2017 02/23/2014  Decreased Interest 0 0 0 0  Down, Depressed, Hopeless 0 0 0 0  PHQ - 2 Score 0 0 0 0  Altered sleeping 0 0 - -  Tired, decreased energy 0 0 - -  Change in appetite 0 0 - -  Feeling bad or failure about yourself  0 1 - -  Trouble concentrating 0 1 - -  Moving slowly or fidgety/restless 0 0 - -  Suicidal thoughts 0 0 - -  PHQ-9 Score 0 2 - -  Difficult doing work/chores Not difficult at all Not difficult at all - -   .Marland Kitchen Discussed 150 minutes of exercise a week.  Encouraged vitamin D 1000 units and Calcium 1300mg  or 4 servings of dairy a day.  Pap up to date.  Need for mammogram. Will order.   Marland Kitchen.Discussed low carb diet with 1500 calories and 80g of protein.  Exercising at least 150 minutes a week.  My Fitness Pal could be a Microbiologist.  Discuss 16:8 IF.  Restart saxenda. Restart titration up to 3mg .  Follow up in 6 months.  See After Visit Summary for Counseling Recommendations

## 2019-07-21 NOTE — Patient Instructions (Signed)
Health Maintenance, Female Adopting a healthy lifestyle and getting preventive care are important in promoting health and wellness. Ask your health care provider about:  The right schedule for you to have regular tests and exams.  Things you can do on your own to prevent diseases and keep yourself healthy. What should I know about diet, weight, and exercise? Eat a healthy diet   Eat a diet that includes plenty of vegetables, fruits, low-fat dairy products, and lean protein.  Do not eat a lot of foods that are high in solid fats, added sugars, or sodium. Maintain a healthy weight Body mass index (BMI) is used to identify weight problems. It estimates body fat based on height and weight. Your health care provider can help determine your BMI and help you achieve or maintain a healthy weight. Get regular exercise Get regular exercise. This is one of the most important things you can do for your health. Most adults should:  Exercise for at least 150 minutes each week. The exercise should increase your heart rate and make you sweat (moderate-intensity exercise).  Do strengthening exercises at least twice a week. This is in addition to the moderate-intensity exercise.  Spend less time sitting. Even light physical activity can be beneficial. Watch cholesterol and blood lipids Have your blood tested for lipids and cholesterol at 40 years of age, then have this test every 5 years. Have your cholesterol levels checked more often if:  Your lipid or cholesterol levels are high.  You are older than 40 years of age.  You are at high risk for heart disease. What should I know about cancer screening? Depending on your health history and family history, you may need to have cancer screening at various ages. This may include screening for:  Breast cancer.  Cervical cancer.  Colorectal cancer.  Skin cancer.  Lung cancer. What should I know about heart disease, diabetes, and high blood  pressure? Blood pressure and heart disease  High blood pressure causes heart disease and increases the risk of stroke. This is more likely to develop in people who have high blood pressure readings, are of African descent, or are overweight.  Have your blood pressure checked: ? Every 3-5 years if you are 18-39 years of age. ? Every year if you are 40 years old or older. Diabetes Have regular diabetes screenings. This checks your fasting blood sugar level. Have the screening done:  Once every three years after age 40 if you are at a normal weight and have a low risk for diabetes.  More often and at a younger age if you are overweight or have a high risk for diabetes. What should I know about preventing infection? Hepatitis B If you have a higher risk for hepatitis B, you should be screened for this virus. Talk with your health care provider to find out if you are at risk for hepatitis B infection. Hepatitis C Testing is recommended for:  Everyone born from 1945 through 1965.  Anyone with known risk factors for hepatitis C. Sexually transmitted infections (STIs)  Get screened for STIs, including gonorrhea and chlamydia, if: ? You are sexually active and are younger than 40 years of age. ? You are older than 40 years of age and your health care provider tells you that you are at risk for this type of infection. ? Your sexual activity has changed since you were last screened, and you are at increased risk for chlamydia or gonorrhea. Ask your health care provider if   you are at risk.  Ask your health care provider about whether you are at high risk for HIV. Your health care provider may recommend a prescription medicine to help prevent HIV infection. If you choose to take medicine to prevent HIV, you should first get tested for HIV. You should then be tested every 3 months for as long as you are taking the medicine. Pregnancy  If you are about to stop having your period (premenopausal) and  you may become pregnant, seek counseling before you get pregnant.  Take 400 to 800 micrograms (mcg) of folic acid every day if you become pregnant.  Ask for birth control (contraception) if you want to prevent pregnancy. Osteoporosis and menopause Osteoporosis is a disease in which the bones lose minerals and strength with aging. This can result in bone fractures. If you are 65 years old or older, or if you are at risk for osteoporosis and fractures, ask your health care provider if you should:  Be screened for bone loss.  Take a calcium or vitamin D supplement to lower your risk of fractures.  Be given hormone replacement therapy (HRT) to treat symptoms of menopause. Follow these instructions at home: Lifestyle  Do not use any products that contain nicotine or tobacco, such as cigarettes, e-cigarettes, and chewing tobacco. If you need help quitting, ask your health care provider.  Do not use street drugs.  Do not share needles.  Ask your health care provider for help if you need support or information about quitting drugs. Alcohol use  Do not drink alcohol if: ? Your health care provider tells you not to drink. ? You are pregnant, may be pregnant, or are planning to become pregnant.  If you drink alcohol: ? Limit how much you use to 0-1 drink a day. ? Limit intake if you are breastfeeding.  Be aware of how much alcohol is in your drink. In the [place] one drink equals one 12 oz bottle of beer (355 mL), one 5 oz glass of wine (148 mL), or one 1 oz glass of hard liquor (44 mL). General instructions  Schedule regular health, dental, and eye exams.  Stay current with your vaccines.  Tell your health care provider if: ? You often feel depressed. ? You have ever been abused or do not feel safe at home. Summary  Adopting a healthy lifestyle and getting preventive care are important in promoting health and wellness.  Follow your health care provider's instructions about healthy  diet, exercising, and getting tested or screened for diseases.  Follow your health care provider's instructions on monitoring your cholesterol and blood pressure. This information is not intended to replace advice given to you by your health care provider. Make sure you discuss any questions you have with your health care provider. Document Released: 06/18/2011 Document Revised: 11/26/2018 Document Reviewed: 11/26/2018 Elsevier Patient Education  2020 [employer]vier Inc.  

## 2019-07-21 NOTE — Telephone Encounter (Signed)
Left message on machine for patient to call back.

## 2019-07-21 NOTE — Telephone Encounter (Signed)
Forgot to discuss. Needs mammogram. Will order.

## 2019-07-22 LAB — LIPID PANEL W/REFLEX DIRECT LDL
Cholesterol: 220 mg/dL — ABNORMAL HIGH (ref <200)
HDL: 60 mg/dL (ref >=50)
LDL Cholesterol (Calc): 134 mg/dL (calc) — ABNORMAL HIGH
Non-HDL Cholesterol (Calc): 160 mg/dL (calc) — ABNORMAL HIGH (ref <130)
Total CHOL/HDL Ratio: 3.7 (calc) (ref <5.0)
Triglycerides: 135 mg/dL (ref <150)

## 2019-07-22 LAB — COMPLETE METABOLIC PANEL WITH GFR
AG Ratio: 1.7 (calc) (ref 1.0–2.5)
ALT: 31 U/L — ABNORMAL HIGH (ref 6–29)
AST: 23 U/L (ref 10–30)
Albumin: 4.4 g/dL (ref 3.6–5.1)
Alkaline phosphatase (APISO): 48 U/L (ref 31–125)
BUN: 13 mg/dL (ref 7–25)
CO2: 28 mmol/L (ref 20–32)
Calcium: 9.5 mg/dL (ref 8.6–10.2)
Chloride: 102 mmol/L (ref 98–110)
Creat: 1 mg/dL (ref 0.50–1.10)
GFR, Est African American: 82 mL/min/{1.73_m2} (ref >=60)
GFR, Est Non African American: 70 mL/min/{1.73_m2} (ref >=60)
Globulin: 2.6 g/dL (calc) (ref 1.9–3.7)
Glucose, Bld: 89 mg/dL (ref 65–99)
Potassium: 4.5 mmol/L (ref 3.5–5.3)
Sodium: 137 mmol/L (ref 135–146)
Total Bilirubin: 0.7 mg/dL (ref 0.2–1.2)
Total Protein: 7 g/dL (ref 6.1–8.1)

## 2019-07-22 NOTE — Telephone Encounter (Signed)
Mychart message sent to patient.

## 2019-07-22 NOTE — Progress Notes (Signed)
Cholesterol looks good. HDL down some which the higher the better here. LDL up some. Optimal is under 100 your at 134. With your risk factors diet and exercise are key. Liver enzymes much better but up just a hair. Kidney and sugars look great.

## 2019-07-22 NOTE — Telephone Encounter (Signed)
Left another message on machine for patient to call back.

## 2019-08-26 ENCOUNTER — Other Ambulatory Visit: Payer: Self-pay

## 2019-08-26 ENCOUNTER — Ambulatory Visit (INDEPENDENT_AMBULATORY_CARE_PROVIDER_SITE_OTHER): Payer: 59

## 2019-08-26 DIAGNOSIS — Z1231 Encounter for screening mammogram for malignant neoplasm of breast: Secondary | ICD-10-CM

## 2019-08-27 NOTE — Progress Notes (Signed)
Confirm with patient that she is scheduled for additional imaging due to possible left breast mass.

## 2019-08-28 ENCOUNTER — Other Ambulatory Visit: Payer: Self-pay | Admitting: Physician Assistant

## 2019-08-28 DIAGNOSIS — R928 Other abnormal and inconclusive findings on diagnostic imaging of breast: Secondary | ICD-10-CM

## 2019-09-04 ENCOUNTER — Ambulatory Visit
Admission: RE | Admit: 2019-09-04 | Discharge: 2019-09-04 | Disposition: A | Discharge status: Home / Self Care | Payer: 59 | Source: Ambulatory Visit | Attending: Physician Assistant | Admitting: Physician Assistant

## 2019-09-04 ENCOUNTER — Other Ambulatory Visit: Payer: Self-pay | Admitting: Physician Assistant

## 2019-09-04 ENCOUNTER — Other Ambulatory Visit: Payer: Self-pay

## 2019-09-04 DIAGNOSIS — N6489 Other specified disorders of breast: Secondary | ICD-10-CM

## 2019-09-04 DIAGNOSIS — R928 Other abnormal and inconclusive findings on diagnostic imaging of breast: Secondary | ICD-10-CM

## 2019-09-11 ENCOUNTER — Other Ambulatory Visit (HOSPITAL_COMMUNITY): Payer: Self-pay | Admitting: Diagnostic Radiology

## 2019-09-11 ENCOUNTER — Ambulatory Visit
Admission: RE | Admit: 2019-09-11 | Discharge: 2019-09-11 | Disposition: A | Discharge status: Home / Self Care | Payer: 59 | Source: Ambulatory Visit | Attending: Physician Assistant | Admitting: Physician Assistant

## 2019-09-11 ENCOUNTER — Other Ambulatory Visit: Payer: Self-pay | Admitting: Physician Assistant

## 2019-09-11 ENCOUNTER — Other Ambulatory Visit: Payer: Self-pay

## 2019-09-11 DIAGNOSIS — N6489 Other specified disorders of breast: Secondary | ICD-10-CM

## 2019-09-14 ENCOUNTER — Telehealth: Payer: Self-pay | Admitting: Physician Assistant

## 2019-09-14 NOTE — Telephone Encounter (Signed)
mychart msg.  

## 2019-09-17 ENCOUNTER — Encounter: Payer: Self-pay | Admitting: Physician Assistant

## 2019-09-29 ENCOUNTER — Other Ambulatory Visit: Payer: Self-pay | Admitting: Physician Assistant

## 2019-09-29 DIAGNOSIS — R0989 Other specified symptoms and signs involving the circulatory and respiratory systems: Secondary | ICD-10-CM

## 2019-10-09 ENCOUNTER — Encounter: Payer: Self-pay | Admitting: Physician Assistant

## 2019-10-09 DIAGNOSIS — E663 Overweight: Secondary | ICD-10-CM

## 2019-10-09 MED ORDER — LIRAGLUTIDE -WEIGHT MANAGEMENT 18 MG/3ML ~~LOC~~ SOPN: 3.0000 mg | PEN_INJECTOR | Freq: Every day | SUBCUTANEOUS | 1 refills | Status: DC

## 2019-11-01 ENCOUNTER — Other Ambulatory Visit: Payer: Self-pay | Admitting: Physician Assistant

## 2019-11-01 DIAGNOSIS — R0989 Other specified symptoms and signs involving the circulatory and respiratory systems: Secondary | ICD-10-CM

## 2019-11-28 ENCOUNTER — Other Ambulatory Visit: Payer: Self-pay | Admitting: Physician Assistant

## 2019-11-28 DIAGNOSIS — R0989 Other specified symptoms and signs involving the circulatory and respiratory systems: Secondary | ICD-10-CM

## 2019-12-01 ENCOUNTER — Other Ambulatory Visit: Payer: Self-pay | Admitting: Physician Assistant

## 2019-12-01 DIAGNOSIS — E663 Overweight: Secondary | ICD-10-CM

## 2019-12-29 ENCOUNTER — Other Ambulatory Visit: Payer: Self-pay | Admitting: Physician Assistant

## 2019-12-29 DIAGNOSIS — R0989 Other specified symptoms and signs involving the circulatory and respiratory systems: Secondary | ICD-10-CM

## 2020-01-12 ENCOUNTER — Other Ambulatory Visit: Payer: Self-pay | Admitting: Physician Assistant

## 2020-01-12 DIAGNOSIS — R0989 Other specified symptoms and signs involving the circulatory and respiratory systems: Secondary | ICD-10-CM

## 2020-03-03 ENCOUNTER — Encounter: Payer: Self-pay | Admitting: Physician Assistant

## 2020-03-03 ENCOUNTER — Other Ambulatory Visit: Payer: Self-pay | Admitting: Physician Assistant

## 2020-03-03 DIAGNOSIS — E663 Overweight: Secondary | ICD-10-CM

## 2020-03-21 ENCOUNTER — Telehealth: Payer: BC Managed Care – PPO | Admitting: Physician Assistant

## 2020-04-04 ENCOUNTER — Other Ambulatory Visit: Payer: Self-pay

## 2020-04-04 ENCOUNTER — Telehealth (INDEPENDENT_AMBULATORY_CARE_PROVIDER_SITE_OTHER): Payer: BC Managed Care – PPO | Admitting: Physician Assistant

## 2020-04-04 DIAGNOSIS — E6609 Other obesity due to excess calories: Secondary | ICD-10-CM

## 2020-04-04 DIAGNOSIS — Z6830 Body mass index (BMI) 30.0-30.9, adult: Secondary | ICD-10-CM

## 2020-04-04 DIAGNOSIS — E663 Overweight: Secondary | ICD-10-CM

## 2020-04-04 MED ORDER — PHENTERMINE-TOPIRAMATE ER 3.75-23 MG PO CP24: 1.0000 | ORAL_CAPSULE | Freq: Every morning | ORAL | 0 refills | Status: DC

## 2020-04-04 MED ORDER — PHENTERMINE-TOPIRAMATE ER 7.5-46 MG PO CP24: 1.0000 | ORAL_CAPSULE | Freq: Every morning | ORAL | 0 refills | Status: DC

## 2020-04-04 MED ORDER — BUPROPION HCL ER (XL) 300 MG PO TB24: 300.0000 mg | ORAL_TABLET | Freq: Every day | ORAL | 1 refills | Status: DC

## 2020-04-04 NOTE — Progress Notes (Signed)
Patient ID: April Jarvis, female   DOB: 1981-08-13, 39 y.o.   MRN: [id number] .Marland KitchenVirtual Visit via Video Note  I connected with Katherline Davie Frett on 04/04/2020 at  8:10 AM EDT by a video enabled telemedicine application and verified that I am speaking with the correct person using two identifiers.  Location: Patient: home Provider: clinic   I discussed the limitations of evaluation and management by telemedicine and the availability of in person appointments. The patient expressed understanding and agreed to proceed.  History of Present Illness: Patient is a 39 year old female who presents to the clinic to discuss weight.  For the last 6 months she has been using [place].  She initially got a great response.  Over the last 3 months she is actually gained weight.  This is very frustrating to her.  She is tried multiple ways to lose weight.  She has tried intermittent fasting but she also was gaining weight with that.  She does feel like Wellbutrin curbs her appetite and helps keep her more stable.  She would like to stay on Wellbutrin.  She feels like she needs to try something other than Saxenda.  She is exercising a few times a week.  She does feel strong and healthy.  She is not interested in surgical options.   .. Active Ambulatory Problems    Diagnosis Date Noted  . 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 NUMBNESS 10/21/2008  . PERONEAL NEUROPATHY 11/04/2008  . Overweight (BMI 25.0-29.9) 02/28/2015  . Hypertriglyceridemia 02/28/2015  . Inattention 02/28/2015  . Abnormal weight gain 04/01/2015  . Hyperlipidemia 10/26/2016  . Elevated LDL cholesterol level 12/04/2017  . Obesity (BMI 30-39.9) 12/04/2017   Resolved Ambulatory Problems    Diagnosis Date Noted  . Ankle sprain 02/28/2015  . Lateral epicondylitis of left elbow 12/04/2017   No Additional Past Medical History   Reviewed med, allergy, problem list.    Observations/Objective: No acute distress.  Normal mood  and appearance.   .. Today's Vitals   04/04/20 0808  Temp: 97.9 F (36.6 C)  TempSrc: Oral  Weight: 192 lb (87.1 kg)  Height: 5\' 6"  (1.676 m)   Body mass index is 30.99 kg/m.    Assessment and Plan: Marland KitchenMarland KitchenLylianah was seen today for weight check.  Diagnoses and all orders for this visit:  Class 1 obesity due to excess calories without serious comorbidity with body mass index (BMI) of 30.0 to 30.9 in adult -     Phentermine-Topiramate 3.75-23 MG CP24; Take 1 tablet by mouth every morning. -     Phentermine-Topiramate 7.5-46 MG CP24; Take 1 tablet by mouth every morning. -     buPROPion (WELLBUTRIN XL) 300 MG 24 hr tablet; Take 1 tablet (300 mg total) by mouth daily.    Kirke Shaggy does not seem to be working.  She will stop this.  Continue on Wellbutrin.  We will add Qsymia.  Start with lowest dose at 3.75/23 mg for 14 days and then increase to 7.5/46 mg daily.  Continue to keep a balanced diet.  Continue to stay active.  Follow-up in 2 months.  Follow Up Instructions:    I discussed the assessment and treatment plan with the patient. The patient was provided an opportunity to ask questions and all were answered. The patient agreed with the plan and demonstrated an understanding of the instructions.   The patient was advised to call back or seek an in-person evaluation if the symptoms worsen or if the condition fails  to improve as anticipated.  I provided 15 minutes of non-face-to-face time during this encounter.   Iran Planas, PA-C

## 2020-04-04 NOTE — Progress Notes (Signed)
Wants refills of Saxenda

## 2020-04-05 ENCOUNTER — Encounter: Payer: Self-pay | Admitting: Physician Assistant

## 2020-04-25 ENCOUNTER — Encounter: Payer: Self-pay | Admitting: Physician Assistant

## 2020-04-26 NOTE — Telephone Encounter (Signed)
I have not received anything for a PA for this I even checked on Cover my meds I will run one and see if it requires it. - CF

## 2020-04-26 NOTE — Telephone Encounter (Signed)
Can you see if PA was needed for Qsymia that was sent in April??  Thanks!

## 2020-04-26 NOTE — Telephone Encounter (Signed)
I sent the prior auth through cover my meds and this medication is a plan exclusion her insurance will not pay. I sent her a message to let her know this information at well. - CF

## 2020-04-26 NOTE — Telephone Encounter (Signed)
I sure will

## 2020-06-17 ENCOUNTER — Encounter: Payer: Self-pay | Admitting: Physician Assistant

## 2020-06-17 ENCOUNTER — Other Ambulatory Visit: Payer: Self-pay | Admitting: Physician Assistant

## 2020-06-17 ENCOUNTER — Telehealth: Payer: Self-pay

## 2020-06-17 DIAGNOSIS — E6609 Other obesity due to excess calories: Secondary | ICD-10-CM

## 2020-06-17 DIAGNOSIS — D225 Melanocytic nevi of trunk: Secondary | ICD-10-CM | POA: Diagnosis not present

## 2020-06-17 DIAGNOSIS — L578 Other skin changes due to chronic exposure to nonionizing radiation: Secondary | ICD-10-CM | POA: Diagnosis not present

## 2020-06-17 DIAGNOSIS — D2361 Other benign neoplasm of skin of right upper limb, including shoulder: Secondary | ICD-10-CM | POA: Diagnosis not present

## 2020-06-17 MED ORDER — PHENTERMINE-TOPIRAMATE ER 11.25-69 MG PO CP24: 1.0000 | ORAL_CAPSULE | Freq: Every morning | ORAL | 0 refills | Status: DC

## 2020-06-17 NOTE — Telephone Encounter (Signed)
Patient was stating that medication is at the pharmacy but states that it is not covered by her insurance and was wanting to get that figured out before she goes on vacation next week, and also stated that she is not near her calendar & wanted to wait to schedule her follow up visit.

## 2020-06-17 NOTE — Telephone Encounter (Signed)
Please schedule appt

## 2020-06-17 NOTE — Telephone Encounter (Signed)
Did you get a prior authorization request for this?

## 2020-06-17 NOTE — Telephone Encounter (Signed)
Pt sent a MyChart msg stating that Qysmia will required PA.

## 2020-06-22 ENCOUNTER — Telehealth (INDEPENDENT_AMBULATORY_CARE_PROVIDER_SITE_OTHER): Payer: BC Managed Care – PPO | Admitting: Physician Assistant

## 2020-06-22 VITALS — BP 118/78 | Ht 66.0 in | Wt 184.0 lb

## 2020-06-22 DIAGNOSIS — E663 Overweight: Secondary | ICD-10-CM

## 2020-06-22 MED ORDER — PHENTERMINE-TOPIRAMATE ER 15-92 MG PO CP24: 1.0000 | ORAL_CAPSULE | Freq: Every morning | ORAL | 0 refills | Status: DC

## 2020-06-22 NOTE — Progress Notes (Signed)
Following up on weight loss medications They are working well - she can tell when they start to "level out"  Needs refills if you want her to continue Weight - 184 Weight on 04/04/2020 - 192

## 2020-06-22 NOTE — Progress Notes (Signed)
Patient ID: April Jarvis, female   DOB: 26-Sep-1981, 39 y.o.   MRN: [id number] .Marland KitchenVirtual Visit via Video Note  I connected with April Jarvis on 06/22/20 at  7:50 AM EDT by a video enabled telemedicine application and verified that I am speaking with the correct person using two identifiers.  Location: Patient: home Provider: clinic   I discussed the limitations of evaluation and management by telemedicine and the availability of in person appointments. The patient expressed understanding and agreed to proceed.  History of Present Illness: Patient is a 39 year old female who is following up on weight loss. On 419 she was 192 today she is 184.  She is exercising regularly.  She is doing a lot of cardio with running.  She notices that her appetite has decreased and she is more able to keep good portion sizes.  She does notice that this does feels like it is leveling out.  She would like to go up to the next dose.  She denies any side effects or concerns.  .. Active Ambulatory Problems    Diagnosis Date Noted  . 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 NUMBNESS 10/21/2008  . PERONEAL NEUROPATHY 11/04/2008  . Overweight (BMI 25.0-29.9) 02/28/2015  . Hypertriglyceridemia 02/28/2015  . Inattention 02/28/2015  . Abnormal weight gain 04/01/2015  . Hyperlipidemia 10/26/2016  . Elevated LDL cholesterol level 12/04/2017  . Obesity (BMI 30-39.9) 12/04/2017   Resolved Ambulatory Problems    Diagnosis Date Noted  . Ankle sprain 02/28/2015  . Lateral epicondylitis of left elbow 12/04/2017   No Additional Past Medical History   Reviewed med, allergy, problem list.      Observations/Objective: No acute distress Normal mood and appearance.   .. Today's Vitals   06/22/20 0715  BP: 118/78  Weight: 184 lb (83.5 kg)  Height: 5\' 6"  (1.676 m)   Body mass index is 29.7 kg/m.    Assessment and Plan: Marland KitchenMarland KitchenDiagnoses and all orders for this visit:  Overweight (BMI  25.0-29.9) -     Phentermine-Topiramate 15-92 MG CP24; Take 1 tablet by mouth every morning.   BMI is now down to 29.  This is great.  Continue with her diet and exercise changes.  Her goal weight is 160 off.  Will increase the Qsymia dose to 15/92 mg daily.  I sent her 30 tablets but she will let me know where she wants the other one sent.  There could be a Qsymia savings program that could save her some money on this medication.  Follow up in 6 months.   Follow Up Instructions:    I discussed the assessment and treatment plan with the patient. The patient was provided an opportunity to ask questions and all were answered. The patient agreed with the plan and demonstrated an understanding of the instructions.   The patient was advised to call back or seek an in-person evaluation if the symptoms worsen or if the condition fails to improve as anticipated.  I provided 7 minutes of non-face-to-face time during this encounter.   Iran Planas, PA-C

## 2020-07-06 NOTE — Telephone Encounter (Signed)
April Jarvis, can you check to see if this PA has been completed? Thanks in advance!

## 2020-07-07 NOTE — Telephone Encounter (Signed)
I have submitted the information to insurance and waiting on a response.

## 2020-07-07 NOTE — Telephone Encounter (Signed)
I left a brief message for patient that prior authorization is in process and I am waiting on insurance to respond. Patient was asked to call back with any questions.

## 2020-07-14 NOTE — Telephone Encounter (Signed)
I checked with insurance and I sent a message to check on the status of the PA. I let patient know I am still waiting on a response from insurance. She voices understanding.

## 2020-07-15 NOTE — Telephone Encounter (Signed)
Received a fax from [facility] that additional information was needed and I faxed the papers back.

## 2020-07-21 ENCOUNTER — Other Ambulatory Visit: Payer: Self-pay | Admitting: Physician Assistant

## 2020-07-21 DIAGNOSIS — E6609 Other obesity due to excess calories: Secondary | ICD-10-CM

## 2020-07-22 NOTE — Telephone Encounter (Signed)
She should be on 15/92 dose not this dose.

## 2020-07-22 NOTE — Telephone Encounter (Signed)
Last filled 06/22/2020 #30 with no refills Not scheduled for her 1 month check

## 2020-07-25 ENCOUNTER — Encounter: Payer: Self-pay | Admitting: Physician Assistant

## 2020-07-25 NOTE — Telephone Encounter (Signed)
Routing to covering provider.  °

## 2020-07-26 ENCOUNTER — Other Ambulatory Visit: Payer: Self-pay | Admitting: Nurse Practitioner

## 2020-07-26 ENCOUNTER — Encounter: Payer: Self-pay | Admitting: Nurse Practitioner

## 2020-07-26 DIAGNOSIS — E663 Overweight: Secondary | ICD-10-CM

## 2020-07-26 MED ORDER — QSYMIA 15-92 MG PO CP24: 1.0000 | ORAL_CAPSULE | Freq: Every day | ORAL | 0 refills | Status: DC

## 2020-07-26 NOTE — Progress Notes (Signed)
Prescription for Qsymia sent to [facility] pharmacy at patient request.

## 2020-08-10 ENCOUNTER — Other Ambulatory Visit: Payer: Self-pay

## 2020-08-10 DIAGNOSIS — E663 Overweight: Secondary | ICD-10-CM

## 2020-08-11 MED ORDER — QSYMIA 15-92 MG PO CP24: 1.0000 | ORAL_CAPSULE | Freq: Every day | ORAL | 2 refills | Status: DC

## 2020-08-16 DIAGNOSIS — Z01419 Encounter for gynecological examination (general) (routine) without abnormal findings: Secondary | ICD-10-CM | POA: Diagnosis not present

## 2020-08-16 DIAGNOSIS — Z6827 Body mass index (BMI) 27.0-27.9, adult: Secondary | ICD-10-CM | POA: Diagnosis not present

## 2020-08-16 DIAGNOSIS — Z1151 Encounter for screening for human papillomavirus (HPV): Secondary | ICD-10-CM | POA: Diagnosis not present

## 2020-08-16 LAB — HM PAP SMEAR: HM Pap smear: NORMAL

## 2020-08-24 ENCOUNTER — Other Ambulatory Visit: Payer: Self-pay | Admitting: Physician Assistant

## 2020-08-24 DIAGNOSIS — E6609 Other obesity due to excess calories: Secondary | ICD-10-CM

## 2020-08-31 ENCOUNTER — Encounter: Payer: Self-pay | Admitting: Physician Assistant

## 2020-08-31 DIAGNOSIS — E663 Overweight: Secondary | ICD-10-CM

## 2020-09-01 NOTE — Telephone Encounter (Signed)
Qsymia pended to [facility]. Please review and sign if appropriate.

## 2020-09-02 MED ORDER — QSYMIA 15-92 MG PO CP24: 1.0000 | ORAL_CAPSULE | Freq: Every day | ORAL | 0 refills | Status: DC

## 2020-09-07 ENCOUNTER — Other Ambulatory Visit: Payer: Self-pay | Admitting: Neurology

## 2020-09-07 DIAGNOSIS — Z1231 Encounter for screening mammogram for malignant neoplasm of breast: Secondary | ICD-10-CM

## 2020-10-28 ENCOUNTER — Encounter: Payer: Self-pay | Admitting: Physician Assistant

## 2020-10-28 NOTE — Telephone Encounter (Signed)
LVM to call us back and schedule CPE

## 2020-12-21 ENCOUNTER — Other Ambulatory Visit: Payer: Self-pay | Admitting: Physician Assistant

## 2020-12-21 DIAGNOSIS — E663 Overweight: Secondary | ICD-10-CM

## 2020-12-21 NOTE — Telephone Encounter (Signed)
Last written 09/02/2020 Last appt 06/22/2020

## 2021-01-02 ENCOUNTER — Encounter: Payer: BC Managed Care – PPO | Admitting: Physician Assistant

## 2021-01-05 ENCOUNTER — Other Ambulatory Visit: Payer: Self-pay | Admitting: Physician Assistant

## 2021-01-05 ENCOUNTER — Other Ambulatory Visit: Payer: Self-pay

## 2021-01-05 ENCOUNTER — Ambulatory Visit (INDEPENDENT_AMBULATORY_CARE_PROVIDER_SITE_OTHER): Payer: BC Managed Care – PPO

## 2021-01-05 DIAGNOSIS — Z1231 Encounter for screening mammogram for malignant neoplasm of breast: Secondary | ICD-10-CM

## 2021-01-06 NOTE — Progress Notes (Signed)
Normal mammogram

## 2021-01-30 ENCOUNTER — Other Ambulatory Visit: Payer: Self-pay | Admitting: Physician Assistant

## 2021-01-30 DIAGNOSIS — E6609 Other obesity due to excess calories: Secondary | ICD-10-CM

## 2021-01-30 DIAGNOSIS — Z6830 Body mass index (BMI) 30.0-30.9, adult: Secondary | ICD-10-CM

## 2021-04-07 DIAGNOSIS — Z20822 Contact with and (suspected) exposure to covid-19: Secondary | ICD-10-CM | POA: Diagnosis not present

## 2021-04-21 ENCOUNTER — Encounter: Payer: Self-pay | Admitting: Physician Assistant

## 2021-04-21 ENCOUNTER — Other Ambulatory Visit: Payer: Self-pay

## 2021-04-21 ENCOUNTER — Ambulatory Visit (INDEPENDENT_AMBULATORY_CARE_PROVIDER_SITE_OTHER): Payer: BC Managed Care – PPO | Admitting: Physician Assistant

## 2021-04-21 VITALS — BP 129/89 | HR 64 | Ht 66.0 in | Wt 196.0 lb

## 2021-04-21 DIAGNOSIS — Z0000 Encounter for general adult medical examination without abnormal findings: Secondary | ICD-10-CM

## 2021-04-21 DIAGNOSIS — R200 Anesthesia of skin: Secondary | ICD-10-CM

## 2021-04-21 DIAGNOSIS — Z1159 Encounter for screening for other viral diseases: Secondary | ICD-10-CM

## 2021-04-21 DIAGNOSIS — Z131 Encounter for screening for diabetes mellitus: Secondary | ICD-10-CM | POA: Diagnosis not present

## 2021-04-21 DIAGNOSIS — E6609 Other obesity due to excess calories: Secondary | ICD-10-CM

## 2021-04-21 DIAGNOSIS — Z1329 Encounter for screening for other suspected endocrine disorder: Secondary | ICD-10-CM

## 2021-04-21 DIAGNOSIS — E782 Mixed hyperlipidemia: Secondary | ICD-10-CM | POA: Diagnosis not present

## 2021-04-21 DIAGNOSIS — Z13220 Encounter for screening for lipoid disorders: Secondary | ICD-10-CM

## 2021-04-21 DIAGNOSIS — H0014 Chalazion left upper eyelid: Secondary | ICD-10-CM

## 2021-04-21 DIAGNOSIS — R202 Paresthesia of skin: Secondary | ICD-10-CM

## 2021-04-21 DIAGNOSIS — Z6830 Body mass index (BMI) 30.0-30.9, adult: Secondary | ICD-10-CM

## 2021-04-21 MED ORDER — NALTREXONE-BUPROPION HCL ER 8-90 MG PO TB12: ORAL_TABLET | ORAL | 0 refills | Status: DC

## 2021-04-21 MED ORDER — NALTREXONE-BUPROPION HCL ER 8-90 MG PO TB12: ORAL_TABLET | ORAL | 2 refills | Status: DC

## 2021-04-21 MED ORDER — PREDNISONE 50 MG PO TABS: ORAL_TABLET | ORAL | 0 refills | Status: DC

## 2021-04-21 NOTE — Patient Instructions (Addendum)
Make referral to eye doctor.   Carpal Tunnel Syndrome  Carpal tunnel syndrome is a condition that causes pain, weakness, and numbness in your hand and arm. Numbness is when you cannot feel an area in your body. The carpal tunnel is a narrow area that is on the palm side of your wrist. Repeated wrist motion or certain diseases may cause swelling in the tunnel. This swelling can pinch the main nerve in the wrist. This nerve is called the median nerve. What are the causes? This condition may be caused by:  Moving your hand and wrist over and over again while doing a task.  Injury to the wrist.  Arthritis.  A sac of fluid (cyst) or abnormal growth (tumor) in the carpal tunnel.  Fluid buildup during pregnancy.  Use of tools that vibrate. Sometimes the cause is not known. What increases the risk? The following factors may make you more likely to have this condition:  Having a job that makes you do these things: ? Move your hand over and over again. ? Work with tools that vibrate, such as drills or sanders.  Being a woman.  Having diabetes, obesity, thyroid problems, or kidney failure. What are the signs or symptoms? Symptoms of this condition include:  A tingling feeling in your fingers.  Tingling or loss of feeling in your hand.  Pain in your entire arm. This pain may get worse when you bend your wrist and elbow for a long time.  Pain in your wrist that goes up your arm to your shoulder.  Pain that goes down into your palm or fingers.  Weakness in your hands. You may find it hard to grab and hold items. You may feel worse at night. How is this treated? This condition may be treated with:  Lifestyle changes. You will be asked to stop or change the activity that caused your problem.  Doing exercises and activities that make bones, muscles, and tendons stronger (physical therapy).  Learning how to use your hand again (occupational therapy).  Medicines for pain and  swelling. You may have injections in your wrist.  A wrist splint or brace.  Surgery. Follow these instructions at home: If you have a splint or brace:  Wear the splint or brace as told by your doctor. Take it off only as told by your doctor.  Loosen the splint if your fingers: ? Tingle. ? Become numb. ? Turn cold and blue.  Keep the splint or brace clean.  If the splint or brace is not waterproof: ? Do not let it get wet. ? Cover it with a watertight covering when you take a bath or a shower. Managing pain, stiffness, and swelling If told, put ice on the painful area:  If you have a removable splint or brace, remove it as told by your doctor.  Put ice in a plastic bag.  Place a towel between your skin and the bag.  Leave the ice on for 20 minutes, 2-3 times per day. Do not fall asleep with the cold pack on your skin.  Take off the ice if your skin turns bright red. This is very important. If you cannot feel pain, heat, or cold, you have a greater risk of damage to the area. Move your fingers often to reduce stiffness and swelling.   General instructions  Take over-the-counter and prescription medicines only as told by your doctor.  Rest your wrist from any activity that may cause pain. If needed, talk with your  boss at work about changes that can help your wrist heal.  Do exercises as told by your doctor, physical therapist, or occupational therapist.  Keep all follow-up visits. Contact a doctor if:  You have new symptoms.  Medicine does not help your pain.  Your symptoms get worse. Get help right away if:  You have very bad numbness or tingling in your wrist or hand. Summary  Carpal tunnel syndrome is a condition that causes pain in your hand and arm.  It is often caused by repeated wrist motions.  Lifestyle changes and medicines are used to treat this problem. Surgery may help in very bad cases.  Follow your doctor's instructions about wearing a splint,  resting your wrist, keeping follow-up visits, and calling for help. This information is not intended to replace advice given to you by your health care provider. Make sure you discuss any questions you have with your health care provider. Document Revised: 04/14/2020 Document Reviewed: 04/14/2020 Elsevier Patient Education  [place] Maintenance, Female Adopting a healthy lifestyle and getting preventive care are important in promoting health and wellness. Ask your health care provider about:  The right schedule for you to have regular tests and exams.  Things you can do on your own to prevent diseases and keep yourself healthy. What should I know about diet, weight, and exercise? Eat a healthy diet  Eat a diet that includes plenty of vegetables, fruits, low-fat dairy products, and lean protein.  Do not eat a lot of foods that are high in solid fats, added sugars, or sodium.   Maintain a healthy weight Body mass index (BMI) is used to identify weight problems. It estimates body fat based on height and weight. Your health care provider can help determine your BMI and help you achieve or maintain a healthy weight. Get regular exercise Get regular exercise. This is one of the most important things you can do for your health. Most adults should:  Exercise for at least 150 minutes each week. The exercise should increase your heart rate and make you sweat (moderate-intensity exercise).  Do strengthening exercises at least twice a week. This is in addition to the moderate-intensity exercise.  Spend less time sitting. Even light physical activity can be beneficial. Watch cholesterol and blood lipids Have your blood tested for lipids and cholesterol at 42 years of age, then have this test every 5 years. Have your cholesterol levels checked more often if:  Your lipid or cholesterol levels are high.  You are older than 42 years of age.  You are at high risk for heart  disease. What should I know about cancer screening? Depending on your health history and family history, you may need to have cancer screening at various ages. This may include screening for:  Breast cancer.  Cervical cancer.  Colorectal cancer.  Skin cancer.  Lung cancer. What should I know about heart disease, diabetes, and high blood pressure? Blood pressure and heart disease  High blood pressure causes heart disease and increases the risk of stroke. This is more likely to develop in people who have high blood pressure readings, are of African descent, or are overweight.  Have your blood pressure checked: ? Every 3-5 years if you are 68-70 years of age. ? Every year if you are 20 years old or older. Diabetes Have regular diabetes screenings. This checks your fasting blood sugar level. Have the screening done:  Once every three years after age 39 if you  are at a normal weight and have a low risk for diabetes.  More often and at a younger age if you are overweight or have a high risk for diabetes. What should I know about preventing infection? Hepatitis B If you have a higher risk for hepatitis B, you should be screened for this virus. Talk with your health care provider to find out if you are at risk for hepatitis B infection. Hepatitis C Testing is recommended for:  Everyone born from [birth year] through 1967.  Anyone with known risk factors for hepatitis C. Sexually transmitted infections (STIs)  Get screened for STIs, including gonorrhea and chlamydia, if: ? You are sexually active and are younger than 42 years of age. ? You are older than 42 years of age and your health care provider tells you that you are at risk for this type of infection. ? Your sexual activity has changed since you were last screened, and you are at increased risk for chlamydia or gonorrhea. Ask your health care provider if you are at risk.  Ask your health care provider about whether you are at high  risk for HIV. Your health care provider may recommend a prescription medicine to help prevent HIV infection. If you choose to take medicine to prevent HIV, you should first get tested for HIV. You should then be tested every 3 months for as long as you are taking the medicine. Pregnancy  If you are about to stop having your period (premenopausal) and you may become pregnant, seek counseling before you get pregnant.  Take 400 to 800 micrograms (mcg) of folic acid every day if you become pregnant.  Ask for birth control (contraception) if you want to prevent pregnancy. Osteoporosis and menopause Osteoporosis is a disease in which the bones lose minerals and strength with aging. This can result in bone fractures. If you are 71 years old or older, or if you are at risk for osteoporosis and fractures, ask your health care provider if you should:  Be screened for bone loss.  Take a calcium or vitamin D supplement to lower your risk of fractures.  Be given hormone replacement therapy (HRT) to treat symptoms of menopause. Follow these instructions at home: Lifestyle  Do not use any products that contain nicotine or tobacco, such as cigarettes, e-cigarettes, and chewing tobacco. If you need help quitting, ask your health care provider.  Do not use street drugs.  Do not share needles.  Ask your health care provider for help if you need support or information about quitting drugs. Alcohol use  Do not drink alcohol if: ? Your health care provider tells you not to drink. ? You are pregnant, may be pregnant, or are planning to become pregnant.  If you drink alcohol: ? Limit how much you use to 0-1 drink a day. ? Limit intake if you are breastfeeding.  Be aware of how much alcohol is in your drink. In the [place] one drink equals one 12 oz bottle of beer (355 mL), one 5 oz glass of wine (148 mL), or one 1 oz glass of hard liquor (44 mL). General instructions  Schedule regular health, dental,  and eye exams.  Stay current with your vaccines.  Tell your health care provider if: ? You often feel depressed. ? You have ever been abused or do not feel safe at home. Summary  Adopting a healthy lifestyle and getting preventive care are important in promoting health and wellness.  Follow your health care provider's instructions  about healthy diet, exercising, and getting tested or screened for diseases.  Follow your health care provider's instructions on monitoring your cholesterol and blood pressure. This information is not intended to replace advice given to you by your health care provider. Make sure you discuss any questions you have with your health care provider. Document Revised: 11/26/2018 Document Reviewed: 11/26/2018 Elsevier Patient Education  2021 [employer]  A chalazion is a swelling or lump on the eyelid. It can affect the upper eyelid or the lower eyelid. What are the causes? This condition may be caused by:  Long-lasting (chronic) inflammation of the eyelid glands.  A blocked oil gland in the eyelid. What are the signs or symptoms? Symptoms of this condition include:  Swelling of the eyelid. The swelling may spread to areas around the eye.  A hard lump on the eyelid.  Blurry vision. The lump on the eyelid may make it hard to see out of the eye. How is this diagnosed? This condition is diagnosed with an examination of the eye. How is this treated? This condition is treated by applying a warm compress to the eyelid. If the condition does not improve, it may be treated with:  Medicine that is injected into the chalazion by a health care provider.  Surgery.  Medicine that is applied to the eye. Follow these instructions at home: Managing pain and swelling  Apply a warm, moist compress to the eyelid 4-6 times a day for 10-15 minutes at a time. This will help to open any blocked glands and to reduce redness and swelling.  Apply  over-the-counter and prescription medicines only as told by your health care provider. General instructions  Do not touch the chalazion.  Do not try to remove the pus. Do not squeeze the chalazion or stick it with a pin or needle.  Do not rub your eyes.  Wash your hands often. Dry your hands with a clean towel.  Keep your face, scalp, and eyebrows clean.  Avoid wearing eye makeup.  If the chalazion does not break open (rupture) on its own, return to your health care provider.  Keep all follow-up appointments as told by your health care provider. This is important. Contact a health care provider if:  Your eyelid has not improved in 4 weeks.  Your eyelid is getting worse.  You have a fever.  The chalazion does not rupture on its own after a month of home treatment. Get help right away if:  You have pain in your eye.  Your vision changes.  The chalazion becomes painful or red.  The chalazion gets bigger. Summary  A chalazion is a swelling or lump on the upper or lower eyelid.  It may be caused by chronic inflammation or a blocked oil gland.  Apply a warm, moist compress to the eyelid 4-6 times a day for 10-15 minutes at a time.  Keep your face, scalp, and eyebrows clean. This information is not intended to replace advice given to you by your health care provider. Make sure you discuss any questions you have with your health care provider. Document Revised: 05/22/2018 Document Reviewed: 05/22/2018 Elsevier Patient Education  [place].

## 2021-04-21 NOTE — Progress Notes (Signed)
Subjective:     April Jarvis is a 40 y.o. female and is here for a comprehensive physical exam. The patient reports see problems.    Pt has a painless eye lump of upper left eyelid. Painful lump occurred months ago and then progressed to this. No vision changes. No eye discharge. Used some warm compresses but did not get better.   She also reports some intermittent bilateral hand numbness and tingling. Occurs about once a week for a few seconds. Usually when driving or at her her computer. No strength changes. She had a 3 hour drive last week and really bothered her.    Social History   Socioeconomic History  . Marital status: Married    Spouse name: Not on file  . Number of children: Not on file  . Years of education: Not on file  . Highest education level: Not on file  Occupational History  . Not on file  Tobacco Use  . Smoking status: Current Some Day Smoker    Packs/day: 0.10    Types: Cigarettes    Last attempt to quit: 07/17/2008    Years since quitting: 12.7  . Smokeless tobacco: Never Used  Substance and Sexual Activity  . Alcohol use: Yes    Alcohol/week: 2.0 standard drinks    Types: 2 Standard drinks or equivalent per week  . Drug use: No  . Sexual activity: Not on file  Other Topics Concern  . Not on file  Social History Narrative  . Not on file   Social Determinants of Health   Financial Resource Strain: Not on file  Food Insecurity: Not on file  Transportation Needs: Not on file  Physical Activity: Not on file  Stress: Not on file  Social Connections: Not on file  Intimate Partner Violence: Not on file   Health Maintenance  Topic Date Due  . HIV Screening  Never done  . Hepatitis C Screening  Never done  . COVID-19 Vaccine (3 - Booster for Moderna series) 05/07/2021 (Originally 12/28/2020)  . INFLUENZA VACCINE  07/17/2021  . TETANUS/TDAP  08/12/2021  . MAMMOGRAM  01/05/2022  . PAP SMEAR-Modifier  08/17/2023  . HPV VACCINES  Aged Out    The  following portions of the patient's history were reviewed and updated as appropriate: allergies, current medications, past family history, past medical history, past social history, past surgical history and problem list.  Review of Systems Pertinent items noted in HPI and remainder of comprehensive ROS otherwise negative.   Objective:    BP 129/89   Pulse 64   Ht 5\' 6"  (1.676 m)   Wt 196 lb (88.9 kg)   SpO2 100%   BMI 31.64 kg/m  General appearance: alert, cooperative and appears stated age Head: Normocephalic, without obvious abnormality, atraumatic Eyes: conjunctivae/corneas clear. PERRL, EOM's intact. Fundi benign. left upper eyelid painless mass.  Ears: normal TM's and external ear canals both ears Nose: Nares normal. Septum midline. Mucosa normal. No drainage or sinus tenderness. Throat: lips, mucosa, and tongue normal; teeth and gums normal Neck: no adenopathy, no carotid bruit, no JVD, supple, symmetrical, trachea midline and thyroid not enlarged, symmetric, no tenderness/mass/nodules Back: symmetric, no curvature. ROM normal. No CVA tenderness. Lungs: clear to auscultation bilaterally Heart: regular rate and rhythm, S1, S2 normal, no murmur, click, rub or gallop Abdomen: soft, non-tender; bowel sounds normal; no masses,  no organomegaly Extremities: extremities normal, atraumatic, no cyanosis or edema Pulses: 2+ and symmetric Skin: Skin color, texture, turgor normal. No  rashes or lesions Lymph nodes: Cervical, supraclavicular, and axillary nodes normal. Neurologic: Alert and oriented X 3, normal strength and tone. Normal symmetric reflexes. Normal coordination and gait   .Marland Kitchen Depression screen [facility] 2/9 04/21/2021 04/04/2020 07/21/2019 09/09/2018 12/04/2017  Decreased Interest 0 0 0 0 0  Down, Depressed, Hopeless 0 0 0 0 0  PHQ - 2 Score 0 0 0 0 0  Altered sleeping - 0 0 0 -  Tired, decreased energy - 0 0 0 -  Change in appetite - 0 0 0 -  Feeling bad or failure about yourself  -  0 0 1 -  Trouble concentrating - 0 0 1 -  Moving slowly or fidgety/restless - 0 0 0 -  Suicidal thoughts - 0 0 0 -  PHQ-9 Score - 0 0 2 -  Difficult doing work/chores - Not difficult at all Not difficult at all Not difficult at all -    Assessment:    Healthy female exam.      Plan:    Marland KitchenMarland KitchenChaquetta was seen today for annual exam.  Diagnoses and all orders for this visit:  Routine physical examination -     CBC with Differential/Platelet -     COMPLETE METABOLIC PANEL WITH GFR -     Lipid Panel w/reflex Direct LDL -     TSH  Screening for lipid disorders -     Lipid Panel w/reflex Direct LDL  Screening for diabetes mellitus -     COMPLETE METABOLIC PANEL WITH GFR  Mixed hyperlipidemia -     Lipid Panel w/reflex Direct LDL  Thyroid disorder screen -     TSH  Numbness and tingling in both hands -     B12 and Folate Panel  Encounter for hepatitis C screening test for low risk patient -     Hepatitis C Antibody  Other orders -     Naltrexone-buPROPion HCl ER 8-90 MG TB12; Take 2 tablets in the morning and 2 tablets in the evening. -     Naltrexone-buPROPion HCl ER 8-90 MG TB12; Starter pack take as directed. -     predniSONE (DELTASONE) 50 MG tablet; One tab PO daily for 5 days.   . Discussed 150 minutes of exercise a week.  Encouraged vitamin D 1000 units and Calcium 1300mg  or 4 servings of dairy a day.  Fasting labs ordered.  PHQ WNL.  Pap and mammogram UTD.  covid vaccine done need for booster.   Marland Kitchen.Discussed low carb diet with 1500 calories and 80g of protein.  Exercising at least 150 minutes a week.  My Fitness Pal could be a Microbiologist.  Does well with just wellbutrin. Wants to try contrave.  Sent starter pack with refills.  Follow up in 6 months.   chalazion of eye-HO given Continue warm compresses Prednisone for 5 days given It has been there for quite sometime will make referral to consider removal.   Sounds like numbness is carpal tunnel  symptoms. HO given. NSAIDs and wrist splints could help. Keep diary of what causes symptoms. Consider Dr. Darene Lamer hydrodissection procedure if continues. B12 ordered.    See After Visit Summary for Counseling Recommendations

## 2021-05-02 ENCOUNTER — Encounter: Payer: Self-pay | Admitting: Physician Assistant

## 2021-05-02 DIAGNOSIS — Z6830 Body mass index (BMI) 30.0-30.9, adult: Secondary | ICD-10-CM

## 2021-05-02 DIAGNOSIS — E6609 Other obesity due to excess calories: Secondary | ICD-10-CM

## 2021-05-03 ENCOUNTER — Encounter: Payer: Self-pay | Admitting: Physician Assistant

## 2021-05-03 ENCOUNTER — Other Ambulatory Visit: Payer: Self-pay | Admitting: Physician Assistant

## 2021-05-03 DIAGNOSIS — H0014 Chalazion left upper eyelid: Secondary | ICD-10-CM

## 2021-05-03 MED ORDER — NALTREXONE-BUPROPION HCL ER 8-90 MG PO TB12: ORAL_TABLET | ORAL | 0 refills | Status: DC

## 2021-05-03 NOTE — Progress Notes (Signed)
Referral

## 2021-06-13 DIAGNOSIS — R200 Anesthesia of skin: Secondary | ICD-10-CM | POA: Diagnosis not present

## 2021-06-13 DIAGNOSIS — Z1159 Encounter for screening for other viral diseases: Secondary | ICD-10-CM | POA: Diagnosis not present

## 2021-06-13 DIAGNOSIS — Z13220 Encounter for screening for lipoid disorders: Secondary | ICD-10-CM | POA: Diagnosis not present

## 2021-06-13 DIAGNOSIS — R748 Abnormal levels of other serum enzymes: Secondary | ICD-10-CM | POA: Diagnosis not present

## 2021-06-13 DIAGNOSIS — E782 Mixed hyperlipidemia: Secondary | ICD-10-CM | POA: Diagnosis not present

## 2021-06-13 DIAGNOSIS — Z131 Encounter for screening for diabetes mellitus: Secondary | ICD-10-CM | POA: Diagnosis not present

## 2021-06-13 DIAGNOSIS — R202 Paresthesia of skin: Secondary | ICD-10-CM | POA: Diagnosis not present

## 2021-06-14 ENCOUNTER — Encounter: Payer: Self-pay | Admitting: Physician Assistant

## 2021-06-14 ENCOUNTER — Other Ambulatory Visit: Payer: Self-pay | Admitting: Physician Assistant

## 2021-06-14 DIAGNOSIS — R7989 Other specified abnormal findings of blood chemistry: Secondary | ICD-10-CM | POA: Insufficient documentation

## 2021-06-14 DIAGNOSIS — R748 Abnormal levels of other serum enzymes: Secondary | ICD-10-CM

## 2021-06-14 LAB — HEPATITIS C ANTIBODY
Hepatitis C Ab: NONREACTIVE
SIGNAL TO CUT-OFF: 0.01 (ref <1.00)

## 2021-06-14 LAB — B12 AND FOLATE PANEL
Folate: 17.1 ng/mL
Vitamin B-12: 461 pg/mL (ref 200–1100)

## 2021-06-14 NOTE — Progress Notes (Signed)
Nabiha,   B12 in normal range and looks good. Down from the 770 it was 12 years ago. You still could consider supplement 500-1024mcg daily to see if gave you more energy etc.  Hemoglobin and WBC looks great.  TSH is elevated. Will add free T4 and T3. This reflects hypo(low) levels of circulating thyroid hormone. Which can be supplemented.  LDL, bad cholesterol, better than last check.  HDL, good cholesterol, looks amazing. To goal due to low risk.  Your liver enzymes are mildly elevated. How much alcohol do you drink? Are you taking tylenol regularly? Most likely cause is fat accumulation around liver. We need to add some labs and get abdominal ultrasound.   Please add to labs: anti TPO, anti thyroglobulin, ferritin, GGT, Free T4 and T3, hepatitis panel, TIBC, serum iron.

## 2021-06-15 LAB — CBC WITH DIFFERENTIAL/PLATELET
Absolute Monocytes: 530 cells/uL (ref 200–950)
Basophils Absolute: 110 cells/uL (ref 0–200)
Basophils Relative: 2.2 %
Eosinophils Absolute: 240 cells/uL (ref 15–500)
Eosinophils Relative: 4.8 %
HCT: 44.3 % (ref 35.0–45.0)
Hemoglobin: 14.8 g/dL (ref 11.7–15.5)
Lymphs Abs: 1685 cells/uL (ref 850–3900)
MCH: 31.7 pg (ref 27.0–33.0)
MCHC: 33.4 g/dL (ref 32.0–36.0)
MCV: 94.9 fL (ref 80.0–100.0)
MPV: 9.8 fL (ref 7.5–12.5)
Monocytes Relative: 10.6 %
Neutro Abs: 2435 cells/uL (ref 1500–7800)
Neutrophils Relative %: 48.7 %
Platelets: 286 10*3/uL (ref 140–400)
RBC: 4.67 10*6/uL (ref 3.80–5.10)
RDW: 11.6 % (ref 11.0–15.0)
Total Lymphocyte: 33.7 %
WBC: 5 10*3/uL (ref 3.8–10.8)

## 2021-06-15 LAB — COMPLETE METABOLIC PANEL WITH GFR
AG Ratio: 1.4 (calc) (ref 1.0–2.5)
ALT: 70 U/L — ABNORMAL HIGH (ref 6–29)
AST: 48 U/L — ABNORMAL HIGH (ref 10–30)
Albumin: 4.2 g/dL (ref 3.6–5.1)
Alkaline phosphatase (APISO): 49 U/L (ref 31–125)
BUN: 19 mg/dL (ref 7–25)
CO2: 27 mmol/L (ref 20–32)
Calcium: 9.7 mg/dL (ref 8.6–10.2)
Chloride: 103 mmol/L (ref 98–110)
Creat: 0.95 mg/dL (ref 0.50–1.10)
GFR, Est African American: 86 mL/min/{1.73_m2} (ref >=60)
GFR, Est Non African American: 74 mL/min/{1.73_m2} (ref >=60)
Globulin: 2.9 g/dL (calc) (ref 1.9–3.7)
Glucose, Bld: 97 mg/dL (ref 65–99)
Potassium: 5.3 mmol/L (ref 3.5–5.3)
Sodium: 137 mmol/L (ref 135–146)
Total Bilirubin: 0.8 mg/dL (ref 0.2–1.2)
Total Protein: 7.1 g/dL (ref 6.1–8.1)

## 2021-06-15 LAB — GAMMA GT: GGT: 73 U/L — ABNORMAL HIGH (ref 3–55)

## 2021-06-15 LAB — TEST AUTHORIZATION

## 2021-06-15 LAB — LIPID PANEL W/REFLEX DIRECT LDL
Cholesterol: 217 mg/dL — ABNORMAL HIGH (ref <200)
HDL: 63 mg/dL (ref >=50)
LDL Cholesterol (Calc): 128 mg/dL (calc) — ABNORMAL HIGH
Non-HDL Cholesterol (Calc): 154 mg/dL (calc) — ABNORMAL HIGH (ref <130)
Total CHOL/HDL Ratio: 3.4 (calc) (ref <5.0)
Triglycerides: 148 mg/dL (ref <150)

## 2021-06-15 LAB — THYROID PEROXIDASE ANTIBODY: Thyroperoxidase Ab SerPl-aCnc: 2 IU/mL (ref <9)

## 2021-06-15 LAB — IRON,TIBC AND FERRITIN PANEL
%SAT: 43 % (calc) (ref 16–45)
Ferritin: 137 ng/mL (ref 16–232)
Iron: 164 ug/dL (ref 40–190)
TIBC: 378 mcg/dL (calc) (ref 250–450)

## 2021-06-15 LAB — HEP PANEL, GENERAL
Hep B Core Total Ab: NONREACTIVE
Hep B S Ab: NONREACTIVE
Hepatitis A AB,Total: NONREACTIVE
Hepatitis B Surface Ag: NONREACTIVE
Hepatitis C Ab: NONREACTIVE
SIGNAL TO CUT-OFF: 0.01 (ref <1.00)

## 2021-06-15 LAB — T3, FREE: T3, Free: 3.1 pg/mL (ref 2.3–4.2)

## 2021-06-15 LAB — TSH: TSH: 4.66 mIU/L — ABNORMAL HIGH

## 2021-06-15 LAB — T4, FREE: Free T4: 1.2 ng/dL (ref 0.8–1.8)

## 2021-06-16 ENCOUNTER — Encounter: Payer: Self-pay | Admitting: Physician Assistant

## 2021-06-16 NOTE — Progress Notes (Signed)
April Jarvis,   Iron panel great.  No hepatitis.  Your Gamma GT levels are elevated. I believe this could be the alcohol. Lets recheck all of this in 3 months to see if liver is healing nicely.  There are natural 3 day liver detox that can have some mild healing effects.  No antibodies to thyroid.  Free levels of thyroid hormone are still in normal range.  I would hold on any treatment, recheck in 3 months,unless you are having hypothyroid symptoms then I would start low dose and recheck in 6 weeks.

## 2021-06-20 ENCOUNTER — Encounter: Payer: Self-pay | Admitting: Physician Assistant

## 2021-06-20 ENCOUNTER — Ambulatory Visit: Payer: BC Managed Care – PPO | Admitting: Physician Assistant

## 2021-06-20 ENCOUNTER — Other Ambulatory Visit: Payer: Self-pay

## 2021-06-20 VITALS — BP 118/66 | HR 72 | Wt 194.0 lb

## 2021-06-20 DIAGNOSIS — E6609 Other obesity due to excess calories: Secondary | ICD-10-CM

## 2021-06-20 DIAGNOSIS — Z6831 Body mass index (BMI) 31.0-31.9, adult: Secondary | ICD-10-CM

## 2021-06-20 DIAGNOSIS — F410 Panic disorder [episodic paroxysmal anxiety] without agoraphobia: Secondary | ICD-10-CM

## 2021-06-20 MED ORDER — WEGOVY 0.25 MG/0.5ML ~~LOC~~ SOAJ: 0.2500 mg | SUBCUTANEOUS | 0 refills | Status: DC

## 2021-06-20 MED ORDER — LORAZEPAM 0.5 MG PO TABS: 0.5000 mg | ORAL_TABLET | Freq: Two times a day (BID) | ORAL | 0 refills | Status: DC | PRN

## 2021-06-20 NOTE — Patient Instructions (Addendum)
[facility].  Panic Attack A panic attack is when you suddenly feel very afraid, uncomfortable, or nervous (anxious). A panic attack can happen when you are scared or for no reason. A panic attack can feel like a serious problem. It can even feel like a heart attack or stroke. See your doctor when you have a panic attack to make sure youdo not have a serious problem. Follow these instructions at home:  Take medicines only as told by your doctor. If you feel worried or nervous, try not to have caffeine. Take good care of your health. To do this: Eat healthy. Make sure to eat fresh fruits and vegetables, whole grains, lean meats, and low-fat dairy. Get enough sleep. Try to sleep for 7-8 hours each night. Exercise. Try to be active for 30 minutes 5 or more days a week. Do not smoke. Talk to your doctor if you need help quitting. Limit how much alcohol you drink: If you are a woman who is not pregnant: try not to have more than 1 drink a day. If you are a man: try not to have more than 2 drinks a day. One drink equals 12 oz of beer, 5 oz of wine, or 1 oz of hard liquor. Keep all follow-up visits as told by your doctor. This is important. Contact a doctor if: Your symptoms do not get better. Your symptoms get worse. You are not able to take your medicines as told. Get help right away if: You have thoughts of hurting yourself or others. You have symptoms of a panic attack. Do not drive yourself to the hospital. Have someone else drive you or call an ambulance. If you feel like you may hurt yourself or others, or have thoughts about taking your own life, get help right away. You can go to your nearest emergency department or call: Your local emergency services (911 in the [place] A suicide crisis helpline, such as the [facility] at 938-450-6742. This is open 24 hours a day. Summary A panic attack is when you suddenly feel very afraid,  uncomfortable, or nervous (anxious). See your doctor when you have a panic attack to make sure that you do not have another serious problem. If you feel like you may hurt yourself or others, get help right away by calling 911. This information is not intended to replace advice given to you by your health care provider. Make sure you discuss any questions you have with your healthcare provider. Document Revised: 06/02/2020 Document Reviewed: 06/02/2020 Elsevier Patient Education  [place].

## 2021-06-20 NOTE — Progress Notes (Signed)
Subjective:    Patient ID: April Jarvis, female    DOB: [birth year], 40 y.o.   MRN: [id number]  HPI Patient is a 40 year old female who presents to the clinic to discuss some weird dizziness and "out of body experience" sensation she has had intermittently since April.   First episode happened in April while driving home from [place] with her father.  She suddenly got this dissociated feeling with dizziness.  She had some numbness and tingling of her extremities.  She pulled over and had to stop driving.  Lasted about 20 minutes and went away.  Few weeks later she would have another 1 sporadically for the last 2 months.  2 weeks ago she had another similar episode while driving her daughter to camp.  This 1 lasted longer. She has some vision blurriness while she is having event but went to ophthalmology and eyes were fine. No diabetes. Eating does not change or make better. No CP, palpitations, headaches.   .. Active Ambulatory Problems    Diagnosis Date Noted   OTHER MONONEURITIS OF LOWER LIMB 10/21/2008   NUMBNESS 10/21/2008   PERONEAL NEUROPATHY 11/04/2008   Overweight (BMI 25.0-29.9) 02/28/2015   Hypertriglyceridemia 02/28/2015   Inattention 02/28/2015   Abnormal weight gain 04/01/2015   Hyperlipidemia 10/26/2016   Elevated LDL cholesterol level 12/04/2017   Obesity (BMI 30-39.9) 12/04/2017   Numbness and tingling in both hands 04/21/2021   Elevated liver enzymes 06/14/2021   Elevated TSH 06/14/2021   Class 1 obesity due to excess calories without serious comorbidity with body mass index (BMI) of 31.0 to 31.9 in adult 06/23/2021   Panic attack 06/23/2021   Resolved Ambulatory Problems    Diagnosis Date Noted   Ankle sprain 02/28/2015   Lateral epicondylitis of left elbow 12/04/2017   No Additional Past Medical History     Review of Systems See HPI.     Objective:   Physical Exam Vitals reviewed.  Constitutional:      Appearance: Normal appearance. She is  obese.  HENT:     Head: Normocephalic.  Neck:     Vascular: No carotid bruit.  Cardiovascular:     Rate and Rhythm: Normal rate and regular rhythm.     Pulses: Normal pulses.     Heart sounds: Normal heart sounds.  Pulmonary:     Effort: Pulmonary effort is normal.     Breath sounds: Normal breath sounds.  Musculoskeletal:     Right lower leg: No edema.     Left lower leg: No edema.  Neurological:     General: No focal deficit present.     Mental Status: She is alert and oriented to person, place, and time.  Psychiatric:     Comments: tearful      .Marland Kitchen Depression screen [facility] 2/9 06/20/2021 04/21/2021 04/04/2020 07/21/2019 09/09/2018  Decreased Interest 0 0 0 0 0  Down, Depressed, Hopeless 0 0 0 0 0  PHQ - 2 Score 0 0 0 0 0  Altered sleeping 0 - 0 0 0  Tired, decreased energy 0 - 0 0 0  Change in appetite 0 - 0 0 0  Feeling bad or failure about yourself  1 - 0 0 1  Trouble concentrating 0 - 0 0 1  Moving slowly or fidgety/restless 0 - 0 0 0  Suicidal thoughts 0 - 0 0 0  PHQ-9 Score 1 - 0 0 2  Difficult doing work/chores Not difficult at all - Not difficult at all Not  difficult at all Not difficult at all    GAD 7 : Generalized Anxiety Score 06/20/2021 04/04/2020 07/21/2019 09/09/2018  Nervous, Anxious, on Edge 1 0 0 1  Control/stop worrying 1 0 0 1  Worry too much - different things 1 0 0 1  Trouble relaxing 1 0 0 1  Restless 0 0 0 1  Easily annoyed or irritable 0 0 0 0  Afraid - awful might happen 0 0 0 1  Total GAD 7 Score 4 0 0 6  Anxiety Difficulty Not difficult at all Not difficult at all Not difficult at all Not difficult at all        Assessment & Plan:  Marland KitchenMarland KitchenShallyn was seen today for dizziness.  Diagnoses and all orders for this visit:  Panic attacks -     LORazepam (ATIVAN) 0.5 MG tablet; Take 1 tablet (0.5 mg total) by mouth 2 (two) times daily as needed for anxiety.  Class 1 obesity due to excess calories without serious comorbidity with body mass index (BMI) of  31.0 to 31.9 in adult  Other orders -     Discontinue: Semaglutide-Weight Management (WEGOVY) 0.25 MG/0.5ML SOAJ; Inject 0.25 mg into the skin once a week.  Symptoms she describes to sound a lot like panic attacks.  Discussed this with patient.  She is more concerned that it is limiting her life and feeling like she cannot always do think she needs to do to take care of her daughter or herself.  Discussed Vistaril versus Ativan.  We will go with Ativan since it is quicker acting and there is no risk of abuse potential with patient.  Discussed use this very sparingly.  At this point we will even use this more as a litmus test to confirm if beneficial this is anxiety.  First time she takes this pill to be around people and let them know she is taken to make sure she is not overly sedated.  Discussed other ways to cognitively work on anxiety and calming herself down.  Counseling could be very beneficial.  Also consideration of daily medication might be warranted in the future. Right now events are not happening daily so will avoid SSRI right now.  Certainly consider herbal remedies for anxiety such as ashwagandha, lavender.  She may visit [facility] for some suggestions  .Marland KitchenDiscussed low carb diet with 1500 calories and 80g of protein.  Exercising at least 150 minutes a week.  My Fitness Pal could be a Microbiologist.  Patient has not had any benefit with Contrave.  She would like to stop.  Perhaps the Wellbutrin component could be making her anxiety worse as well.  She has tried [place] in the past.  She be interested in [place].  Kirke Shaggy did work very well she does stop taking.  [facility] but concerned we may not be able to have been stopped.  She may need to try Saxenda again.  Follow up in 4 weeks.   Spent 30 minutes with patient discussing anxiety and causes as well as treatment plan.

## 2021-06-22 ENCOUNTER — Encounter: Payer: Self-pay | Admitting: Physician Assistant

## 2021-06-23 DIAGNOSIS — E6609 Other obesity due to excess calories: Secondary | ICD-10-CM | POA: Insufficient documentation

## 2021-06-23 DIAGNOSIS — E66811 Obesity, class 1: Secondary | ICD-10-CM | POA: Insufficient documentation

## 2021-06-23 DIAGNOSIS — F410 Panic disorder [episodic paroxysmal anxiety] without agoraphobia: Secondary | ICD-10-CM | POA: Insufficient documentation

## 2021-06-23 MED ORDER — SAXENDA 18 MG/3ML ~~LOC~~ SOPN: 0.6000 mg | PEN_INJECTOR | Freq: Every day | SUBCUTANEOUS | 0 refills | Status: DC

## 2021-06-28 ENCOUNTER — Telehealth: Payer: Self-pay

## 2021-06-28 NOTE — Telephone Encounter (Signed)
Fax received from pharmacy indicating a PA was needed for Saxenda 18mg /82ml. Using covermymeds, a PA was completed and submitted; awaiting response.

## 2021-06-30 DIAGNOSIS — L814 Other melanin hyperpigmentation: Secondary | ICD-10-CM | POA: Diagnosis not present

## 2021-06-30 DIAGNOSIS — L578 Other skin changes due to chronic exposure to nonionizing radiation: Secondary | ICD-10-CM | POA: Diagnosis not present

## 2021-06-30 DIAGNOSIS — L570 Actinic keratosis: Secondary | ICD-10-CM | POA: Diagnosis not present

## 2021-06-30 DIAGNOSIS — L821 Other seborrheic keratosis: Secondary | ICD-10-CM | POA: Diagnosis not present

## 2021-07-06 MED ORDER — SAXENDA 18 MG/3ML ~~LOC~~ SOPN: 0.6000 mg | PEN_INJECTOR | Freq: Every day | SUBCUTANEOUS | 0 refills | Status: DC

## 2021-07-06 MED ORDER — PEN NEEDLES 32G X 6 MM MISC: 99 refills | Status: DC

## 2021-07-06 NOTE — Addendum Note (Signed)
Addended by: Narda Rutherford on: 07/06/2021 11:04 AM   Modules accepted: Orders

## 2021-07-18 ENCOUNTER — Ambulatory Visit: Payer: BC Managed Care – PPO | Admitting: Physician Assistant

## 2021-07-27 ENCOUNTER — Other Ambulatory Visit: Payer: Self-pay | Admitting: Physician Assistant

## 2021-07-27 DIAGNOSIS — E6609 Other obesity due to excess calories: Secondary | ICD-10-CM

## 2021-08-22 ENCOUNTER — Ambulatory Visit: Payer: BC Managed Care – PPO | Admitting: Physician Assistant

## 2021-08-23 ENCOUNTER — Other Ambulatory Visit: Payer: Self-pay | Admitting: Physician Assistant

## 2021-08-29 ENCOUNTER — Ambulatory Visit: Payer: BC Managed Care – PPO | Admitting: Physician Assistant

## 2021-09-04 ENCOUNTER — Other Ambulatory Visit: Payer: Self-pay | Admitting: Physician Assistant

## 2021-09-04 DIAGNOSIS — Z1231 Encounter for screening mammogram for malignant neoplasm of breast: Secondary | ICD-10-CM

## 2021-09-06 ENCOUNTER — Other Ambulatory Visit: Payer: Self-pay | Admitting: Physician Assistant

## 2021-09-06 ENCOUNTER — Other Ambulatory Visit: Payer: Self-pay | Admitting: Neurology

## 2021-09-06 DIAGNOSIS — F410 Panic disorder [episodic paroxysmal anxiety] without agoraphobia: Secondary | ICD-10-CM

## 2021-09-06 MED ORDER — LORAZEPAM 0.5 MG PO TABS: 0.5000 mg | ORAL_TABLET | Freq: Two times a day (BID) | ORAL | 0 refills | Status: DC | PRN

## 2021-09-06 NOTE — Addendum Note (Signed)
Addended byAnnamaria Helling on: 09/06/2021 11:31 AM   Modules accepted: Orders

## 2021-09-06 NOTE — Telephone Encounter (Signed)
Last written 06/20/2021 #20 no refills, last seen that day

## 2021-09-06 NOTE — Telephone Encounter (Signed)
Patient left vm that she wants to schedule appt for follow up on anxiety/panic attacks. Please call to schedule. [phone]

## 2021-09-06 NOTE — Telephone Encounter (Signed)
Patient has been scheduled for October 5th, as patient is out of town for next week & didn't want to do a virtual visit next week while out of town. Patient wanted a refill on medication below up until her appointment date.  LORazepam (ATIVAN) 0.5 MG tablet

## 2021-09-08 ENCOUNTER — Telehealth: Payer: Self-pay

## 2021-09-08 NOTE — Telephone Encounter (Signed)
PA notification received from CoverMyMeds for Semaglutide-Weight Management (WEGOVY) 0.25 MG/0.5ML SOAJ. Not sent to plan because pt was changed to Liraglutide -Weight Management ([place]) 18 MG/3ML SOPN on 06/23/21

## 2021-09-20 ENCOUNTER — Encounter: Payer: Self-pay | Admitting: Physician Assistant

## 2021-09-20 ENCOUNTER — Ambulatory Visit: Payer: BC Managed Care – PPO | Admitting: Physician Assistant

## 2021-10-13 ENCOUNTER — Telehealth: Payer: Self-pay

## 2021-10-13 NOTE — Telephone Encounter (Signed)
Medication: Liraglutide -Weight Management ([place]) 18 MG/3ML SOPN Prior authorization submitted via CoverMyMeds on 10/13/2021 PA submission pending

## 2021-10-23 ENCOUNTER — Encounter: Payer: Self-pay | Admitting: Physician Assistant

## 2021-10-23 ENCOUNTER — Telehealth: Payer: Self-pay | Admitting: Physician Assistant

## 2021-10-23 ENCOUNTER — Ambulatory Visit: Payer: BC Managed Care – PPO | Admitting: Physician Assistant

## 2021-10-23 NOTE — Telephone Encounter (Signed)
LVM for patient to call back to get this appt scheduled. AM

## 2021-10-23 NOTE — Telephone Encounter (Signed)
Pt called at 10:04.  Her father is dying and she is unable to keep her appt for this morning.

## 2021-10-23 NOTE — Telephone Encounter (Signed)
Thank you :)

## 2021-10-23 NOTE — Telephone Encounter (Signed)
Ok to take her off.

## 2021-10-27 ENCOUNTER — Telehealth: Payer: Self-pay

## 2021-10-27 NOTE — Telephone Encounter (Signed)
Medication: Liraglutide -Weight Management (April Jarvis) 18 MG/3ML SOPN Prior authorization submitted via CoverMyMeds on 10/27/2021 PA submission pending

## 2021-11-14 ENCOUNTER — Other Ambulatory Visit: Payer: Self-pay | Admitting: Physician Assistant

## 2021-11-16 MED ORDER — SAXENDA 18 MG/3ML ~~LOC~~ SOPN: PEN_INJECTOR | SUBCUTANEOUS | 2 refills | Status: DC

## 2021-11-21 ENCOUNTER — Telehealth: Payer: Self-pay

## 2021-11-21 ENCOUNTER — Telehealth (INDEPENDENT_AMBULATORY_CARE_PROVIDER_SITE_OTHER): Payer: BC Managed Care – PPO | Admitting: Physician Assistant

## 2021-11-21 ENCOUNTER — Encounter: Payer: Self-pay | Admitting: Physician Assistant

## 2021-11-21 VITALS — Ht 66.0 in | Wt 190.0 lb

## 2021-11-21 DIAGNOSIS — Z6831 Body mass index (BMI) 31.0-31.9, adult: Secondary | ICD-10-CM

## 2021-11-21 DIAGNOSIS — R41840 Attention and concentration deficit: Secondary | ICD-10-CM | POA: Diagnosis not present

## 2021-11-21 DIAGNOSIS — F4321 Adjustment disorder with depressed mood: Secondary | ICD-10-CM | POA: Diagnosis not present

## 2021-11-21 DIAGNOSIS — F410 Panic disorder [episodic paroxysmal anxiety] without agoraphobia: Secondary | ICD-10-CM

## 2021-11-21 DIAGNOSIS — E6609 Other obesity due to excess calories: Secondary | ICD-10-CM

## 2021-11-21 MED ORDER — BUPROPION HCL ER (XL) 300 MG PO TB24: 300.0000 mg | ORAL_TABLET | Freq: Every day | ORAL | 3 refills | Status: DC

## 2021-11-21 MED ORDER — WEGOVY 2.4 MG/0.75ML ~~LOC~~ SOAJ: 2.4000 mg | SUBCUTANEOUS | 1 refills | Status: DC

## 2021-11-21 MED ORDER — LORAZEPAM 0.5 MG PO TABS: 0.5000 mg | ORAL_TABLET | Freq: Two times a day (BID) | ORAL | 1 refills | Status: DC | PRN

## 2021-11-21 NOTE — Progress Notes (Signed)
..  Virtual Visit via Video Note  I connected with April Jarvis on 11/21/21 at  9:30 AM EST by a video enabled telemedicine application and verified that I am speaking with the correct person using two identifiers.  Location: Patient: home Provider: clinic  .Marland KitchenParticipating in visit:  Patient: April Jarvis Provider: Iran Planas PA-C Provider in training: Haroldine Laws PA-S   I discussed the limitations of evaluation and management by telemedicine and the availability of in person appointments. The patient expressed understanding and agreed to proceed.  History of Present Illness: Pt is a 40 yo female who presents to the clinic to follow up on saxenda for weight loss.   She is doing ok with saxenda. She has only lost 4lbs but she has been going through a lot. Her dad past away in November. She denies any GI symptoms. No problems tolerating medication.   .. Active Ambulatory Problems    Diagnosis Date Noted   OTHER MONONEURITIS OF LOWER LIMB 10/21/2008   NUMBNESS 10/21/2008   PERONEAL NEUROPATHY 11/04/2008   Overweight (BMI 25.0-29.9) 02/28/2015   Hypertriglyceridemia 02/28/2015   Inattention 02/28/2015   Abnormal weight gain 04/01/2015   Hyperlipidemia 10/26/2016   Elevated LDL cholesterol level 12/04/2017   Obesity (BMI 30-39.9) 12/04/2017   Numbness and tingling in both hands 04/21/2021   Elevated liver enzymes 06/14/2021   Elevated TSH 06/14/2021   Class 1 obesity due to excess calories without serious comorbidity with body mass index (BMI) of 31.0 to 31.9 in adult 06/23/2021   Panic attacks 06/23/2021   Grief 11/21/2021   Resolved Ambulatory Problems    Diagnosis Date Noted   Ankle sprain 02/28/2015   Lateral epicondylitis of left elbow 12/04/2017   No Additional Past Medical History       Observations/Objective: No acute distress Normal mood and appearance  .Marland Kitchen Today's Vitals   11/21/21 0922  Weight: 190 lb (86.2 kg)  Height: 5\' 6"  (1.676 m)   Body  mass index is 30.67 kg/m.     Assessment and Plan: Marland KitchenMarland KitchenLa was seen today for follow-up.  Diagnoses and all orders for this visit:  Class 1 obesity due to excess calories without serious comorbidity with body mass index (BMI) of 31.0 to 31.9 in adult -     Semaglutide-Weight Management (WEGOVY) 2.4 MG/0.75ML SOAJ; Inject 2.4 mg into the skin once a week.  Panic attacks -     LORazepam (ATIVAN) 0.5 MG tablet; Take 1 tablet (0.5 mg total) by mouth 2 (two) times daily as needed for anxiety.  Inattention -     buPROPion (WELLBUTRIN XL) 300 MG 24 hr tablet; Take 1 tablet (300 mg total) by mouth daily.  Grief  Stop saxenda. Start wegovy.  Continue diet changes.  Continue wellbutrin.  Ativan only as needed.  Discussed hospice counseling for grief.  Follow up in 6 months.     Follow Up Instructions:    I discussed the assessment and treatment plan with the patient. The patient was provided an opportunity to ask questions and all were answered. The patient agreed with the plan and demonstrated an understanding of the instructions.   The patient was advised to call back or seek an in-person evaluation if the symptoms worsen or if the condition fails to improve as anticipated.    Iran Planas, PA-C

## 2021-11-21 NOTE — Telephone Encounter (Signed)
Medication: Semaglutide-Weight Management (WEGOVY) 2.4 MG/0.75ML SOAJ Prior authorization submitted via CoverMyMeds on 11/21/2021 PA submission pending

## 2021-11-21 NOTE — Telephone Encounter (Signed)
Medication: Semaglutide-Weight Management (WEGOVY) 2.4 MG/0.75ML SOAJ Prior authorization determination received Medication has been approved Approval dates: 10/22/2021-06/19/2022  Patient aware via: [place] aware: Yes Provider aware via this encounter

## 2021-11-21 NOTE — Progress Notes (Signed)
PHQ9 (3) -GAD7 (4)  completed.  She did add Wellbutrin back in (she was on Contrave for Weight loss, but went back on Wellbutrin when she went to [facility])  Follow up weight - 190 lb today, July appt 194 lbs

## 2021-11-24 DIAGNOSIS — H0014 Chalazion left upper eyelid: Secondary | ICD-10-CM | POA: Diagnosis not present

## 2021-11-27 NOTE — Telephone Encounter (Signed)
Medication: Liraglutide -Weight Management ([place]) 18 MG/3ML SOPN Prior authorization determination received Medication has been denied on 11/23/21

## 2021-12-19 DIAGNOSIS — Z1151 Encounter for screening for human papillomavirus (HPV): Secondary | ICD-10-CM | POA: Diagnosis not present

## 2021-12-19 DIAGNOSIS — Z124 Encounter for screening for malignant neoplasm of cervix: Secondary | ICD-10-CM | POA: Diagnosis not present

## 2021-12-19 DIAGNOSIS — O209 Hemorrhage in early pregnancy, unspecified: Secondary | ICD-10-CM | POA: Diagnosis not present

## 2021-12-19 DIAGNOSIS — O200 Threatened abortion: Secondary | ICD-10-CM | POA: Diagnosis not present

## 2021-12-19 DIAGNOSIS — Z6828 Body mass index (BMI) 28.0-28.9, adult: Secondary | ICD-10-CM | POA: Diagnosis not present

## 2021-12-19 DIAGNOSIS — Z01419 Encounter for gynecological examination (general) (routine) without abnormal findings: Secondary | ICD-10-CM | POA: Diagnosis not present

## 2021-12-19 DIAGNOSIS — Z3201 Encounter for pregnancy test, result positive: Secondary | ICD-10-CM | POA: Diagnosis not present

## 2022-01-02 DIAGNOSIS — O039 Complete or unspecified spontaneous abortion without complication: Secondary | ICD-10-CM | POA: Diagnosis not present

## 2022-01-04 ENCOUNTER — Other Ambulatory Visit: Payer: Self-pay | Admitting: Physician Assistant

## 2022-01-04 DIAGNOSIS — Z1231 Encounter for screening mammogram for malignant neoplasm of breast: Secondary | ICD-10-CM

## 2022-01-09 ENCOUNTER — Other Ambulatory Visit: Payer: Self-pay

## 2022-01-09 ENCOUNTER — Ambulatory Visit: Payer: BC Managed Care – PPO | Admitting: Physician Assistant

## 2022-01-09 ENCOUNTER — Encounter: Payer: Self-pay | Admitting: Physician Assistant

## 2022-01-09 VITALS — BP 138/88 | HR 65 | Ht 66.0 in | Wt 186.0 lb

## 2022-01-09 DIAGNOSIS — R200 Anesthesia of skin: Secondary | ICD-10-CM

## 2022-01-09 DIAGNOSIS — Z131 Encounter for screening for diabetes mellitus: Secondary | ICD-10-CM

## 2022-01-09 DIAGNOSIS — Z23 Encounter for immunization: Secondary | ICD-10-CM

## 2022-01-09 DIAGNOSIS — R748 Abnormal levels of other serum enzymes: Secondary | ICD-10-CM

## 2022-01-09 DIAGNOSIS — Z13220 Encounter for screening for lipoid disorders: Secondary | ICD-10-CM | POA: Diagnosis not present

## 2022-01-09 DIAGNOSIS — R202 Paresthesia of skin: Secondary | ICD-10-CM

## 2022-01-09 DIAGNOSIS — R7989 Other specified abnormal findings of blood chemistry: Secondary | ICD-10-CM | POA: Diagnosis not present

## 2022-01-09 DIAGNOSIS — Z79899 Other long term (current) drug therapy: Secondary | ICD-10-CM

## 2022-01-09 DIAGNOSIS — F410 Panic disorder [episodic paroxysmal anxiety] without agoraphobia: Secondary | ICD-10-CM

## 2022-01-09 DIAGNOSIS — E782 Mixed hyperlipidemia: Secondary | ICD-10-CM

## 2022-01-09 NOTE — Progress Notes (Signed)
Subjective:    Patient ID: April Jarvis, female    DOB: 07/30/1981, 41 y.o.   MRN: [id number]  HPI Pt is a 41 yo female who presents to the clinic for follow up.   She is on wegovy for weight loss and doing well. She just went up to the 2.4mg  dose. Tolerating well. She is close to BMI under 30 which she is happy about. She is not exercising like she should but is very active.   She has cut back on alcohol intake since last elevated liver enzymes. She is drinking 1-2 glasses of wine a day.   She continues to have anxiety and panic attacks. She got worried one day because her ativan did not work. She has had a very hard year. Her father passed away. She denies any SI/HC. She is on wellbutrin.   She is also having numbness and tingling in bother hands at night when her arms are lifted. No strength changes. No neck pain. No arm pain or injuries. Not tried anything to make better.   .. Active Ambulatory Problems    Diagnosis Date Noted   OTHER MONONEURITIS OF LOWER LIMB 10/21/2008   NUMBNESS 10/21/2008   PERONEAL NEUROPATHY 11/04/2008   Overweight (BMI 25.0-29.9) 02/28/2015   Hypertriglyceridemia 02/28/2015   Inattention 02/28/2015   Abnormal weight gain 04/01/2015   Hyperlipidemia 10/26/2016   Elevated LDL cholesterol level 12/04/2017   Obesity (BMI 30-39.9) 12/04/2017   Numbness and tingling in both hands 04/21/2021   Elevated liver enzymes 06/14/2021   Elevated TSH 06/14/2021   Class 1 obesity due to excess calories without serious comorbidity with body mass index (BMI) of 31.0 to 31.9 in adult 06/23/2021   Panic attacks 06/23/2021   Grief 11/21/2021   Resolved Ambulatory Problems    Diagnosis Date Noted   Ankle sprain 02/28/2015   Lateral epicondylitis of left elbow 12/04/2017   No Additional Past Medical History       Review of Systems See HPI.     Objective:   Physical Exam Vitals reviewed.  Constitutional:      Appearance: Normal appearance. She is obese.   HENT:     Head: Normocephalic.  Eyes:     Conjunctiva/sclera: Conjunctivae normal.  Neck:     Vascular: No carotid bruit.  Cardiovascular:     Rate and Rhythm: Normal rate and regular rhythm.     Pulses: Normal pulses.     Heart sounds: Normal heart sounds.  Pulmonary:     Effort: Pulmonary effort is normal.     Breath sounds: Normal breath sounds.  Musculoskeletal:     Right lower leg: No edema.     Left lower leg: No edema.     Comments: Negative tinels and phalens bilaterally. NROM of cervical spine. No tenderness over cervical spine to palpation.  Tightness of the upper shoulder muscles Upper ext strength 5/5.   Lymphadenopathy:     Cervical: No cervical adenopathy.  Skin:    Findings: No rash.  Neurological:     General: No focal deficit present.     Mental Status: She is alert and oriented to person, place, and time.  Psychiatric:        Mood and Affect: Mood normal.   .. Depression screen [facility] 2/9 01/09/2022 11/21/2021 06/20/2021 04/21/2021 04/04/2020  Decreased Interest 0 0 0 0 0  Down, Depressed, Hopeless 0 1 0 0 0  PHQ - 2 Score 0 1 0 0 0  Altered sleeping 0  0 0 - 0  Tired, decreased energy 1 1 0 - 0  Change in appetite 1 0 0 - 0  Feeling bad or failure about yourself  0 1 1 - 0  Trouble concentrating 0 0 0 - 0  Moving slowly or fidgety/restless 0 0 0 - 0  Suicidal thoughts 0 0 0 - 0  PHQ-9 Score 2 3 1  - 0  Difficult doing work/chores Not difficult at all Not difficult at all Not difficult at all - Not difficult at all   .Marland Kitchen GAD 7 : Generalized Anxiety Score 01/09/2022 11/21/2021 06/20/2021 04/04/2020  Nervous, Anxious, on Edge 1 1 1  0  Control/stop worrying 0 1 1 0  Worry too much - different things 1 1 1  0  Trouble relaxing 0 1 1 0  Restless 0 0 0 0  Easily annoyed or irritable 1 0 0 0  Afraid - awful might happen 0 0 0 0  Total GAD 7 Score 3 4 4  0  Anxiety Difficulty Not difficult at all Not difficult at all Not difficult at all Not difficult at all             Assessment & Plan:  Marland KitchenMarland KitchenJeri was seen today for follow-up.  Diagnoses and all orders for this visit:  Elevated liver enzymes -     COMPLETE METABOLIC PANEL WITH GFR  Elevated TSH -     TSH -     T4, free -     T3, free  Screening for lipid disorders  Screening for diabetes mellitus -     COMPLETE METABOLIC PANEL WITH GFR  Mixed hyperlipidemia  Numbness and tingling in both hands -     B12 and Folate Panel  Medication management -     TSH -     COMPLETE METABOLIC PANEL WITH GFR -     CBC with Differential/Platelet -     B12 and Folate Panel -     T4, free -     T3, free  Need for Tdap vaccination -     Tdap vaccine greater than or equal to 7yo IM  Panic attacks -     T4, free -     T3, free   Recheck labs.  PE not significant for carpal tunnel. Likely more upper back muscle impingement at night on nerve into hands. Consider massages. Sleep with cervical pillow.  Will check tSH and B12.  Concerned about anxiety. Consider counseling. Likely needs SSRI. Consider at least adding buspar.  Follow up as needed or in 2 months.

## 2022-01-09 NOTE — Patient Instructions (Signed)
Buspar up to three times a day  Zoloft/Celexa/lexapro- SSRI

## 2022-01-10 ENCOUNTER — Ambulatory Visit (INDEPENDENT_AMBULATORY_CARE_PROVIDER_SITE_OTHER): Payer: BC Managed Care – PPO

## 2022-01-10 DIAGNOSIS — Z1231 Encounter for screening mammogram for malignant neoplasm of breast: Secondary | ICD-10-CM | POA: Diagnosis not present

## 2022-01-10 LAB — COMPLETE METABOLIC PANEL WITH GFR
AG Ratio: 1.6 (calc) (ref 1.0–2.5)
ALT: 36 U/L — ABNORMAL HIGH (ref 6–29)
AST: 23 U/L (ref 10–30)
Albumin: 4.7 g/dL (ref 3.6–5.1)
Alkaline phosphatase (APISO): 52 U/L (ref 31–125)
BUN: 9 mg/dL (ref 7–25)
CO2: 27 mmol/L (ref 20–32)
Calcium: 9.9 mg/dL (ref 8.6–10.2)
Chloride: 102 mmol/L (ref 98–110)
Creat: 0.91 mg/dL (ref 0.50–0.99)
Globulin: 2.9 g/dL (calc) (ref 1.9–3.7)
Glucose, Bld: 76 mg/dL (ref 65–99)
Potassium: 4.8 mmol/L (ref 3.5–5.3)
Sodium: 138 mmol/L (ref 135–146)
Total Bilirubin: 1 mg/dL (ref 0.2–1.2)
Total Protein: 7.6 g/dL (ref 6.1–8.1)
eGFR: 81 mL/min/{1.73_m2} (ref >=60)

## 2022-01-10 LAB — CBC WITH DIFFERENTIAL/PLATELET
Absolute Monocytes: 561 cells/uL (ref 200–950)
Basophils Absolute: 73 cells/uL (ref 0–200)
Basophils Relative: 1.1 %
Eosinophils Absolute: 264 cells/uL (ref 15–500)
Eosinophils Relative: 4 %
HCT: 40.4 % (ref 35.0–45.0)
Hemoglobin: 14 g/dL (ref 11.7–15.5)
Lymphs Abs: 2475 cells/uL (ref 850–3900)
MCH: 32.2 pg (ref 27.0–33.0)
MCHC: 34.7 g/dL (ref 32.0–36.0)
MCV: 92.9 fL (ref 80.0–100.0)
MPV: 9.9 fL (ref 7.5–12.5)
Monocytes Relative: 8.5 %
Neutro Abs: 3227 cells/uL (ref 1500–7800)
Neutrophils Relative %: 48.9 %
Platelets: 325 10*3/uL (ref 140–400)
RBC: 4.35 10*6/uL (ref 3.80–5.10)
RDW: 11.3 % (ref 11.0–15.0)
Total Lymphocyte: 37.5 %
WBC: 6.6 10*3/uL (ref 3.8–10.8)

## 2022-01-10 LAB — TSH: TSH: 3.37 mIU/L

## 2022-01-10 LAB — B12 AND FOLATE PANEL
Folate: >24 ng/mL
Vitamin B-12: 655 pg/mL (ref 200–1100)

## 2022-01-10 LAB — T3, FREE: T3, Free: 3.2 pg/mL (ref 2.3–4.2)

## 2022-01-10 LAB — T4, FREE: Free T4: 1.3 ng/dL (ref 0.8–1.8)

## 2022-01-10 NOTE — Progress Notes (Signed)
Normal mammogram. Follow up in 1 year.

## 2022-01-10 NOTE — Progress Notes (Signed)
Liver enzymes much better. Try to keep to no more than 2 glasses of wine at night. Kidney function good.  Thyroid in normal range and free levels right where they need to be Hemoglobin looks good B12 looks good

## 2022-01-12 MED ORDER — BUSPIRONE HCL 5 MG PO TABS: 5.0000 mg | ORAL_TABLET | Freq: Three times a day (TID) | ORAL | 0 refills | Status: DC

## 2022-03-03 ENCOUNTER — Encounter: Payer: Self-pay | Admitting: Physician Assistant

## 2022-03-03 DIAGNOSIS — F410 Panic disorder [episodic paroxysmal anxiety] without agoraphobia: Secondary | ICD-10-CM

## 2022-03-05 MED ORDER — LORAZEPAM 0.5 MG PO TABS: 0.5000 mg | ORAL_TABLET | Freq: Two times a day (BID) | ORAL | 1 refills | Status: DC | PRN

## 2022-04-02 ENCOUNTER — Other Ambulatory Visit: Payer: Self-pay | Admitting: Physician Assistant

## 2022-04-02 DIAGNOSIS — E6609 Other obesity due to excess calories: Secondary | ICD-10-CM

## 2022-05-10 ENCOUNTER — Other Ambulatory Visit: Payer: Self-pay | Admitting: Physician Assistant

## 2022-05-10 DIAGNOSIS — E6609 Other obesity due to excess calories: Secondary | ICD-10-CM

## 2022-05-28 ENCOUNTER — Ambulatory Visit (INDEPENDENT_AMBULATORY_CARE_PROVIDER_SITE_OTHER): Payer: BC Managed Care – PPO | Admitting: Physician Assistant

## 2022-05-28 ENCOUNTER — Encounter: Payer: Self-pay | Admitting: Physician Assistant

## 2022-05-28 VITALS — BP 129/88 | HR 75 | Ht 66.0 in | Wt 167.0 lb

## 2022-05-28 DIAGNOSIS — F410 Panic disorder [episodic paroxysmal anxiety] without agoraphobia: Secondary | ICD-10-CM | POA: Diagnosis not present

## 2022-05-28 DIAGNOSIS — R7989 Other specified abnormal findings of blood chemistry: Secondary | ICD-10-CM

## 2022-05-28 DIAGNOSIS — Z0000 Encounter for general adult medical examination without abnormal findings: Secondary | ICD-10-CM

## 2022-05-28 DIAGNOSIS — R232 Flushing: Secondary | ICD-10-CM

## 2022-05-28 DIAGNOSIS — F411 Generalized anxiety disorder: Secondary | ICD-10-CM

## 2022-05-28 DIAGNOSIS — R0789 Other chest pain: Secondary | ICD-10-CM

## 2022-05-28 DIAGNOSIS — Z79899 Other long term (current) drug therapy: Secondary | ICD-10-CM

## 2022-05-28 DIAGNOSIS — Z131 Encounter for screening for diabetes mellitus: Secondary | ICD-10-CM

## 2022-05-28 DIAGNOSIS — E663 Overweight: Secondary | ICD-10-CM | POA: Diagnosis not present

## 2022-05-28 MED ORDER — BUSPIRONE HCL 5 MG PO TABS: 5.0000 mg | ORAL_TABLET | Freq: Three times a day (TID) | ORAL | 0 refills | Status: DC

## 2022-05-28 MED ORDER — WEGOVY 2.4 MG/0.75ML ~~LOC~~ SOAJ: 2.4000 mg | SUBCUTANEOUS | 1 refills | Status: DC

## 2022-05-28 MED ORDER — LORAZEPAM 0.5 MG PO TABS: 0.5000 mg | ORAL_TABLET | Freq: Two times a day (BID) | ORAL | 2 refills | Status: DC | PRN

## 2022-05-28 NOTE — Patient Instructions (Signed)

## 2022-05-28 NOTE — Progress Notes (Signed)
Subjective:     April Jarvis is a 41 y.o. female and is here for a comprehensive physical exam. The patient reports problems - having some rest sharp chest pains intermittently. Causes her to worry a lot. Never any pain with exertion. Some flushing from time to time that is bothersome. She admits to having anxiety and worry and not trying the buspar. She is doing great with exercise and diet. She loves the wegovy. Down 19lbs since January .    Social History   Socioeconomic History   Marital status: Married    Spouse name: Not on file   Number of children: Not on file   Years of education: Not on file   Highest education level: Not on file  Occupational History   Not on file  Tobacco Use   Smoking status: Some Days    Packs/day: 0.10    Types: Cigarettes    Last attempt to quit: 07/17/2008    Years since quitting: 13.8   Smokeless tobacco: Never  Substance and Sexual Activity   Alcohol use: Yes    Alcohol/week: 2.0 standard drinks of alcohol    Types: 2 Standard drinks or equivalent per week   Drug use: No   Sexual activity: Not on file  Other Topics Concern   Not on file  Social History Narrative   Not on file   Social Determinants of Health   Financial Resource Strain: Not on file  Food Insecurity: Not on file  Transportation Needs: Not on file  Physical Activity: Not on file  Stress: Not on file  Social Connections: Not on file  Intimate Partner Violence: Not on file   Health Maintenance  Topic Date Due   HIV Screening  Never done   COVID-19 Vaccine (4 - Booster for Moderna series) 06/13/2022 (Originally 10/03/2021)   INFLUENZA VACCINE  07/17/2022   MAMMOGRAM  01/10/2023   PAP SMEAR-Modifier  08/17/2023   TETANUS/TDAP  01/10/2032   Hepatitis C Screening  Completed   HPV VACCINES  Aged Out    The following portions of the patient's history were reviewed and updated as appropriate: allergies, current medications, past family history, past medical history,  past social history, past surgical history, and problem list.  Review of Systems Pertinent items noted in HPI and remainder of comprehensive ROS otherwise negative.   Objective:    BP 129/88   Pulse 75   Ht '5\' 6"'$  (1.676 m)   Wt 167 lb (75.8 kg)   SpO2 100%   BMI 26.95 kg/m  General appearance: alert, cooperative, and appears stated age Head: Normocephalic, without obvious abnormality, atraumatic Eyes: conjunctivae/corneas clear. PERRL, EOM's intact. Fundi benign. Ears: normal TM's and external ear canals both ears Nose: Nares normal. Septum midline. Mucosa normal. No drainage or sinus tenderness. Throat: lips, mucosa, and tongue normal; teeth and gums normal Neck: no adenopathy, no carotid bruit, no JVD, supple, symmetrical, trachea midline, and thyroid not enlarged, symmetric, no tenderness/mass/nodules Back: symmetric, no curvature. ROM normal. No CVA tenderness. Lungs: clear to auscultation bilaterally Heart: regular rate and rhythm, S1, S2 normal, no murmur, click, rub or gallop Abdomen: soft, non-tender; bowel sounds normal; no masses,  no organomegaly Extremities: extremities normal, atraumatic, no cyanosis or edema Pulses: 2+ and symmetric Skin: Skin color, texture, turgor normal. No rashes or lesions Lymph nodes: Cervical, supraclavicular, and axillary nodes normal. Neurologic: Alert and oriented X 3, normal strength and tone. Normal symmetric reflexes. Normal coordination and gait   .Marland Kitchen  05/28/2022    9:49 AM 01/09/2022    3:15 PM 11/21/2021    9:23 AM 06/20/2021   11:28 AM 04/21/2021    9:19 AM  Depression screen PHQ 2/9  Decreased Interest 0 0 0 0 0  Down, Depressed, Hopeless 0 0 1 0 0  PHQ - 2 Score 0 0 1 0 0  Altered sleeping 0 0 0 0   Tired, decreased energy 0 1 1 0   Change in appetite 0 1 0 0   Feeling bad or failure about yourself  0 0 1 1   Trouble concentrating 0 0 0 0   Moving slowly or fidgety/restless 0 0 0 0   Suicidal thoughts 0 0 0 0   PHQ-9 Score 0  '2 3 1   '$ Difficult doing work/chores Not difficult at all Not difficult at all Not difficult at all Not difficult at all    .Marland Kitchen    05/28/2022    9:50 AM 01/09/2022    3:16 PM 11/21/2021    9:24 AM 06/20/2021   11:28 AM  GAD 7 : Generalized Anxiety Score  Nervous, Anxious, on Edge '2 1 1 1  '$ Control/stop worrying 1 0 1 1  Worry too much - different things '1 1 1 1  '$ Trouble relaxing 0 0 1 1  Restless 0 0 0 0  Easily annoyed or irritable 0 1 0 0  Afraid - awful might happen 0 0 0 0  Total GAD 7 Score '4 3 4 4  '$ Anxiety Difficulty Not difficult at all Not difficult at all Not difficult at all Not difficult at all   EKG: NSR, no ST elevation or Depression.    Assessment:    Healthy female exam.     Plan:    Marland KitchenMarland KitchenKorine was seen today for annual exam.  Diagnoses and all orders for this visit:  Routine physical examination -     COMPLETE METABOLIC PANEL WITH GFR -     TSH -     Lipid Panel w/reflex Direct LDL  Overweight (BMI 25.0-29.9) -     Semaglutide-Weight Management (WEGOVY) 2.4 MG/0.75ML SOAJ; Inject 2.4 mg into the skin once a week.  Panic attacks -     busPIRone (BUSPAR) 5 MG tablet; Take 1 tablet (5 mg total) by mouth 3 (three) times daily. -     LORazepam (ATIVAN) 0.5 MG tablet; Take 1 tablet (0.5 mg total) by mouth 2 (two) times daily as needed for anxiety.  Atypical chest pain  GAD (generalized anxiety disorder) -     busPIRone (BUSPAR) 5 MG tablet; Take 1 tablet (5 mg total) by mouth 3 (three) times daily.  Elevated TSH -     TSH  Medication management -     COMPLETE METABOLIC PANEL WITH GFR  Screening for diabetes mellitus -     COMPLETE METABOLIC PANEL WITH GFR  Flushing -     busPIRone (BUSPAR) 5 MG tablet; Take 1 tablet (5 mg total) by mouth 3 (three) times daily.   .. Discussed 150 minutes of exercise a week.  Encouraged vitamin D 1000 units and Calcium '1300mg'$  or 4 servings of dairy a day.  PHQ great GAD 4 but reports anxiety to be a bigger  problem Start buspar HO given Down 19lbs Continue wegovy Follow up in 6 months EKG looks GREAT. Suspect anxiety causing CP.  Fasting labs ordered Will recheck TSH Continue to work on smoking cessation Vaccines UTD Declined covid vaccine booster Pap UTD. Mammogram  UTD.   See After Visit Summary for Counseling Recommendations

## 2022-05-29 NOTE — Addendum Note (Signed)
Addended by: Narda Rutherford on: 05/29/2022 11:54 AM   Modules accepted: Orders

## 2022-06-11 ENCOUNTER — Other Ambulatory Visit: Payer: Self-pay | Admitting: Physician Assistant

## 2022-06-11 DIAGNOSIS — R748 Abnormal levels of other serum enzymes: Secondary | ICD-10-CM

## 2022-06-20 ENCOUNTER — Other Ambulatory Visit: Payer: Self-pay | Admitting: Physician Assistant

## 2022-06-20 DIAGNOSIS — R232 Flushing: Secondary | ICD-10-CM

## 2022-06-20 DIAGNOSIS — F410 Panic disorder [episodic paroxysmal anxiety] without agoraphobia: Secondary | ICD-10-CM

## 2022-06-20 DIAGNOSIS — F411 Generalized anxiety disorder: Secondary | ICD-10-CM

## 2022-07-04 ENCOUNTER — Other Ambulatory Visit: Payer: Self-pay | Admitting: Physician Assistant

## 2022-07-04 DIAGNOSIS — F410 Panic disorder [episodic paroxysmal anxiety] without agoraphobia: Secondary | ICD-10-CM

## 2022-07-04 DIAGNOSIS — F411 Generalized anxiety disorder: Secondary | ICD-10-CM

## 2022-07-04 DIAGNOSIS — R232 Flushing: Secondary | ICD-10-CM

## 2022-07-06 DIAGNOSIS — D225 Melanocytic nevi of trunk: Secondary | ICD-10-CM | POA: Diagnosis not present

## 2022-07-06 DIAGNOSIS — L570 Actinic keratosis: Secondary | ICD-10-CM | POA: Diagnosis not present

## 2022-07-06 DIAGNOSIS — L814 Other melanin hyperpigmentation: Secondary | ICD-10-CM | POA: Diagnosis not present

## 2022-07-06 DIAGNOSIS — L578 Other skin changes due to chronic exposure to nonionizing radiation: Secondary | ICD-10-CM | POA: Diagnosis not present

## 2022-08-01 IMAGING — MG MM DIGITAL SCREENING BILAT W/ TOMO AND CAD
8 series · 8 of 24 positions shown · non-contrast
Comparison: Previous exam(s).

CLINICAL DATA: Screening.

EXAM:
DIGITAL SCREENING BILATERAL MAMMOGRAM WITH TOMOSYNTHESIS AND CAD
TECHNIQUE: Bilateral screening digital craniocaudal and mediolateral oblique
mammograms were obtained. Bilateral screening digital breast
tomosynthesis was performed. The images were evaluated with
computer-aided detection.

[L CC synth-2D]
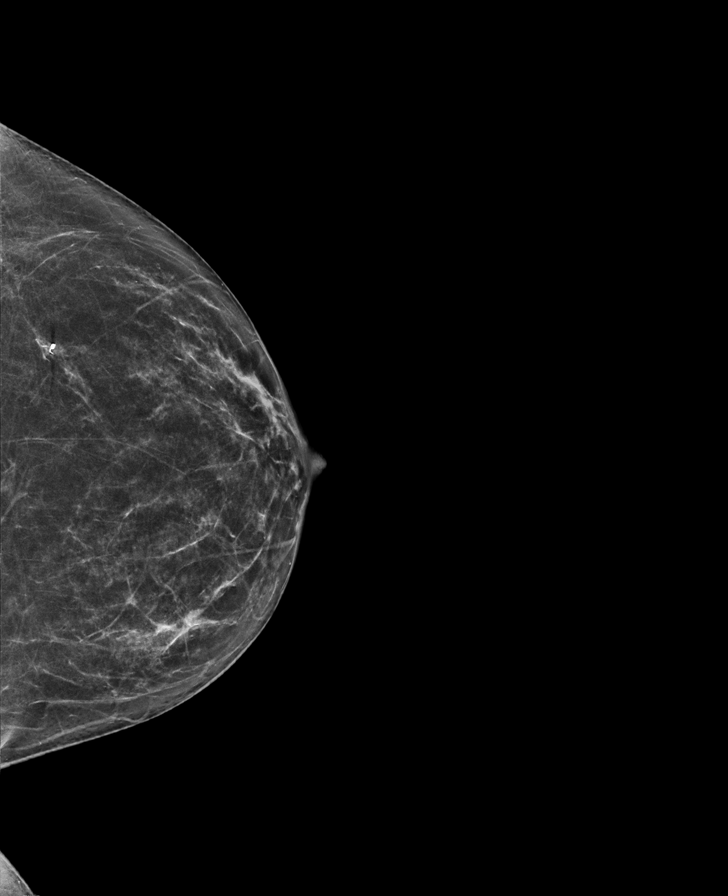

[R CC synth-2D]
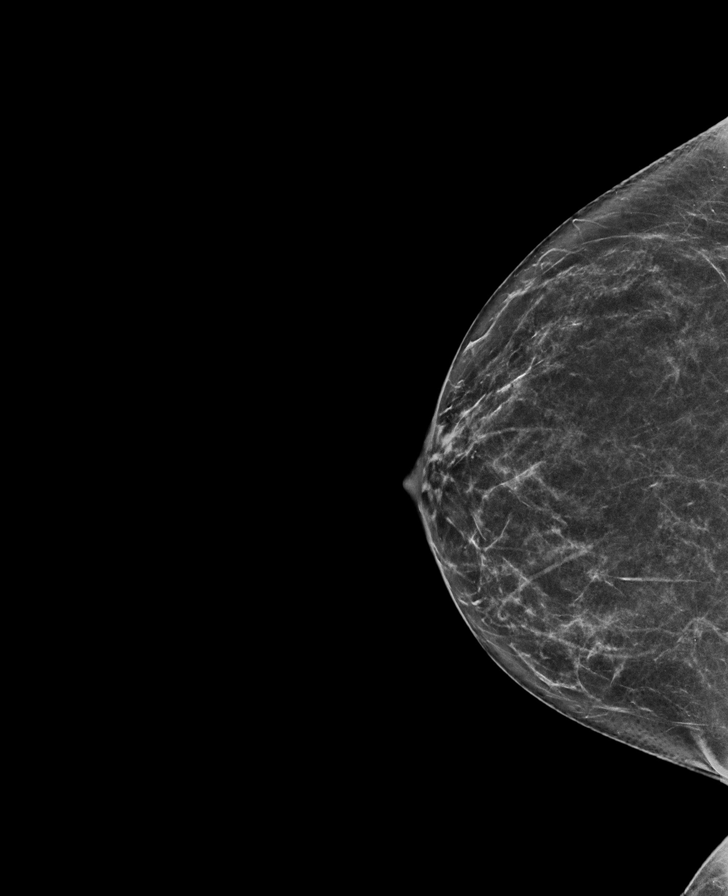

[R MLO synth-2D]
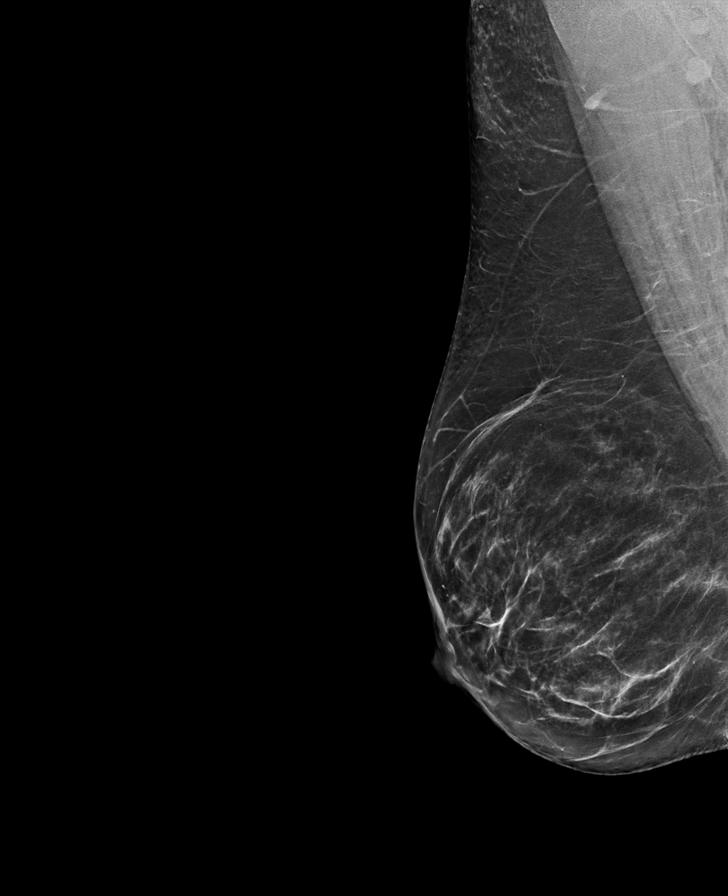

[L MLO synth-2D]
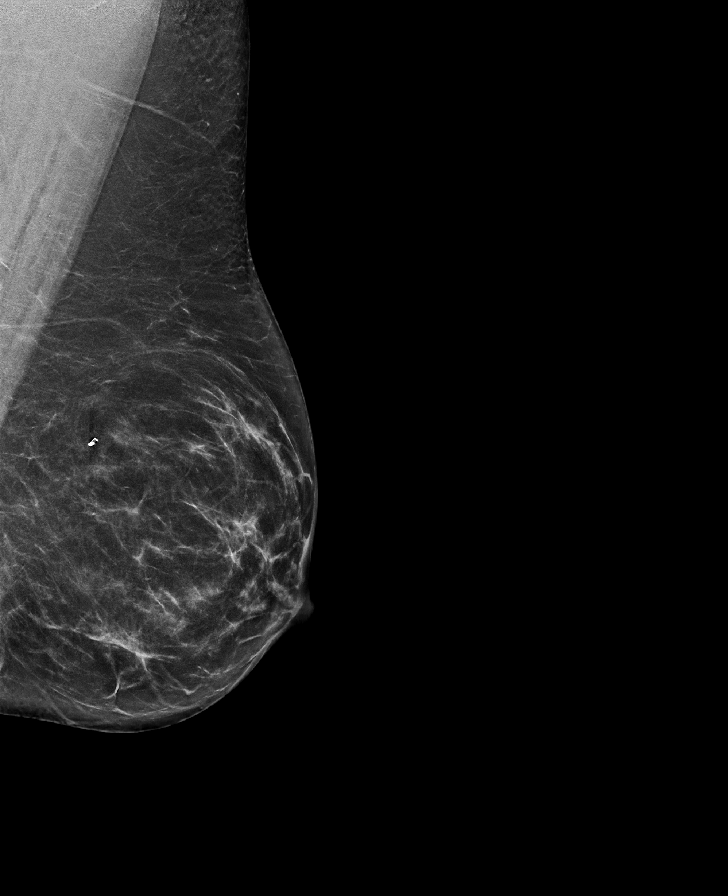

[L CC tomo · tomo slice 31/62.0]
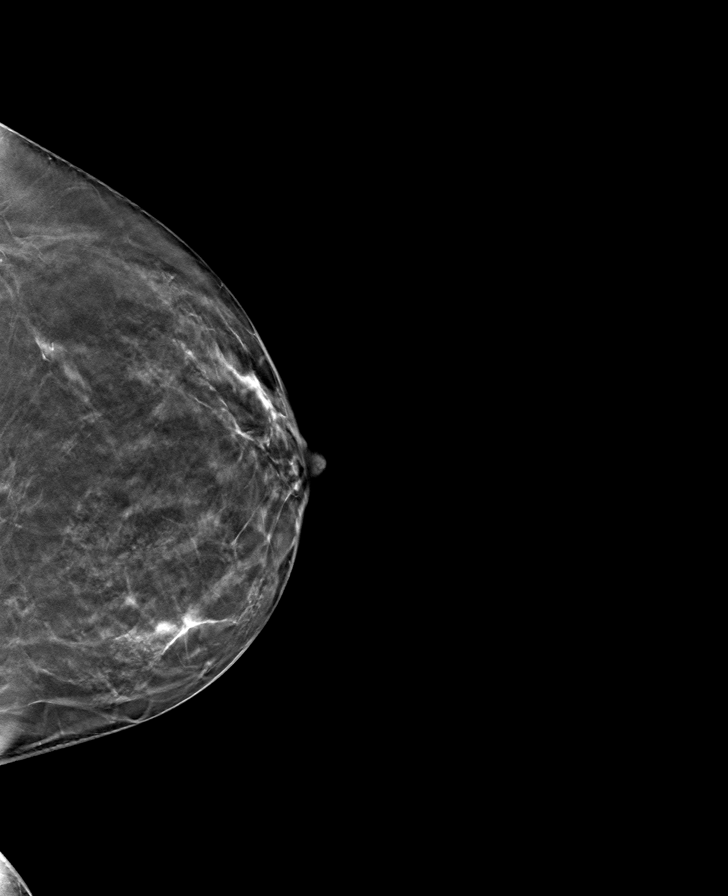

[R CC tomo · tomo slice 32/63.0]
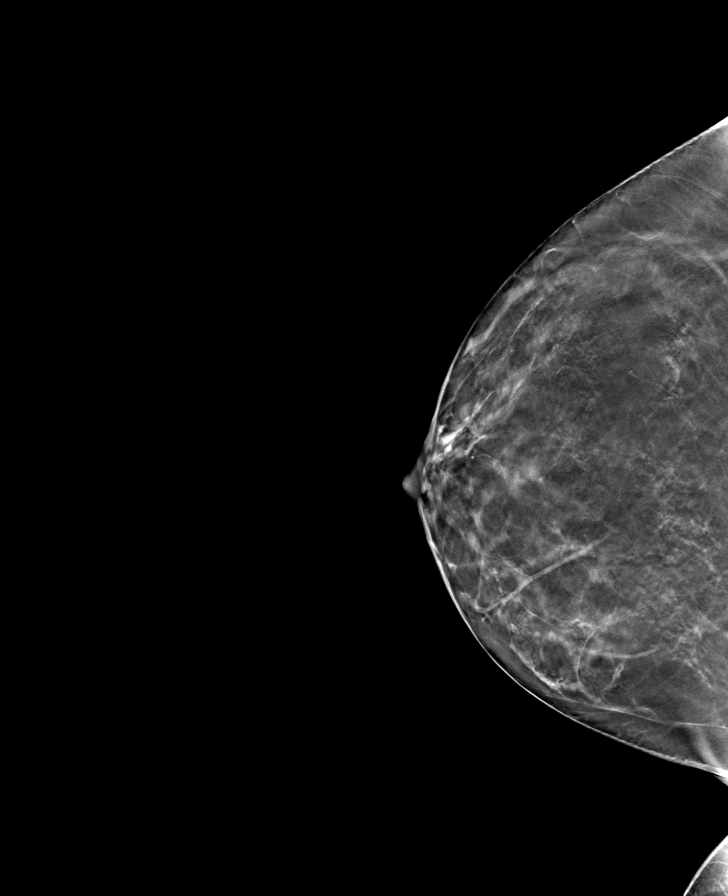

[L MLO tomo · tomo slice 39/76.0]
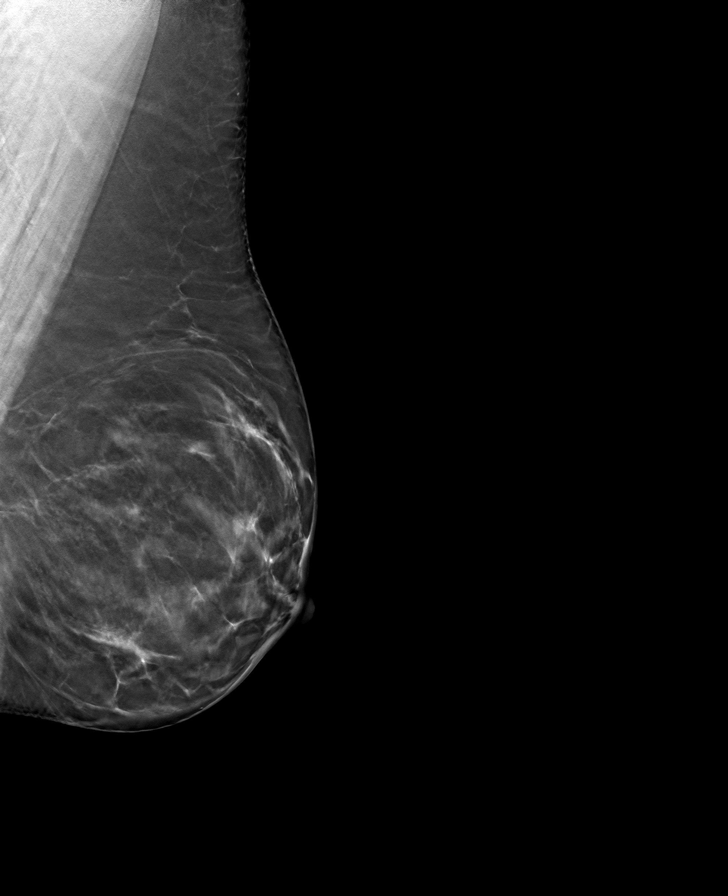

[R MLO tomo · tomo slice 38/75.0]
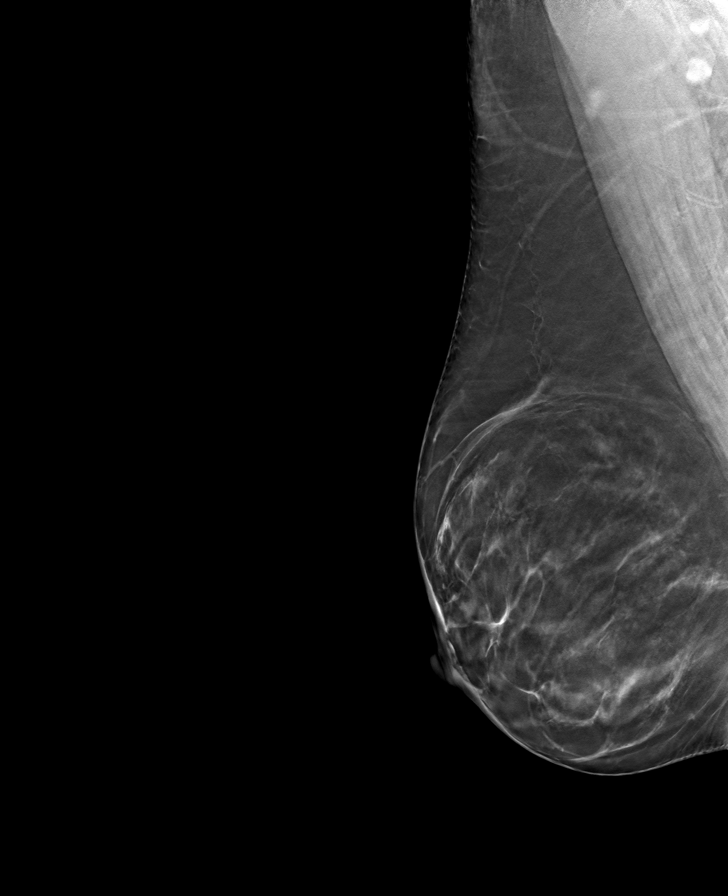

[8 of 24 positions shown; findings below may reference images not displayed]

ACR Breast Density Category b: There are scattered areas of
fibroglandular density.
FINDINGS: There are no findings suspicious for malignancy.
IMPRESSION: No mammographic evidence of malignancy. A result letter of this
screening mammogram will be mailed directly to the patient.

RECOMMENDATION:
Screening mammogram in one year. (Code:51-O-LD2)

BI-RADS CATEGORY  1: Negative.

## 2022-08-07 ENCOUNTER — Encounter: Payer: Self-pay | Admitting: Physician Assistant

## 2022-08-14 ENCOUNTER — Encounter: Payer: Self-pay | Admitting: Physician Assistant

## 2022-09-02 ENCOUNTER — Other Ambulatory Visit: Payer: Self-pay | Admitting: Physician Assistant

## 2022-09-02 DIAGNOSIS — F411 Generalized anxiety disorder: Secondary | ICD-10-CM

## 2022-09-02 DIAGNOSIS — F410 Panic disorder [episodic paroxysmal anxiety] without agoraphobia: Secondary | ICD-10-CM

## 2022-09-02 DIAGNOSIS — R232 Flushing: Secondary | ICD-10-CM

## 2022-09-03 ENCOUNTER — Encounter: Payer: Self-pay | Admitting: Physician Assistant

## 2022-09-03 DIAGNOSIS — R232 Flushing: Secondary | ICD-10-CM

## 2022-09-03 DIAGNOSIS — F411 Generalized anxiety disorder: Secondary | ICD-10-CM

## 2022-09-03 DIAGNOSIS — F410 Panic disorder [episodic paroxysmal anxiety] without agoraphobia: Secondary | ICD-10-CM

## 2022-09-03 MED ORDER — BUSPIRONE HCL 5 MG PO TABS: 5.0000 mg | ORAL_TABLET | Freq: Three times a day (TID) | ORAL | 2 refills | Status: DC

## 2022-09-04 MED ORDER — BUSPIRONE HCL 5 MG PO TABS: 5.0000 mg | ORAL_TABLET | Freq: Three times a day (TID) | ORAL | 0 refills | Status: DC

## 2022-09-26 ENCOUNTER — Encounter: Payer: Self-pay | Admitting: Physician Assistant

## 2022-10-14 ENCOUNTER — Other Ambulatory Visit: Payer: Self-pay | Admitting: Physician Assistant

## 2022-10-14 DIAGNOSIS — F410 Panic disorder [episodic paroxysmal anxiety] without agoraphobia: Secondary | ICD-10-CM

## 2022-10-14 DIAGNOSIS — R232 Flushing: Secondary | ICD-10-CM

## 2022-10-14 DIAGNOSIS — F411 Generalized anxiety disorder: Secondary | ICD-10-CM

## 2022-10-17 ENCOUNTER — Other Ambulatory Visit: Payer: Self-pay | Admitting: Neurology

## 2022-10-17 DIAGNOSIS — F410 Panic disorder [episodic paroxysmal anxiety] without agoraphobia: Secondary | ICD-10-CM

## 2022-10-17 DIAGNOSIS — R232 Flushing: Secondary | ICD-10-CM

## 2022-10-17 DIAGNOSIS — F411 Generalized anxiety disorder: Secondary | ICD-10-CM

## 2022-10-17 MED ORDER — BUSPIRONE HCL 5 MG PO TABS: 5.0000 mg | ORAL_TABLET | Freq: Three times a day (TID) | ORAL | 0 refills | Status: DC

## 2022-11-13 ENCOUNTER — Other Ambulatory Visit: Payer: Self-pay | Admitting: Physician Assistant

## 2022-11-13 DIAGNOSIS — E663 Overweight: Secondary | ICD-10-CM

## 2022-11-16 ENCOUNTER — Other Ambulatory Visit: Payer: Self-pay | Admitting: Physician Assistant

## 2022-11-16 DIAGNOSIS — R41840 Attention and concentration deficit: Secondary | ICD-10-CM

## 2022-11-27 ENCOUNTER — Encounter: Payer: Self-pay | Admitting: Physician Assistant

## 2022-11-27 ENCOUNTER — Ambulatory Visit: Payer: BC Managed Care – PPO | Admitting: Physician Assistant

## 2022-11-27 VITALS — BP 134/86 | HR 72 | Ht 66.0 in | Wt 155.0 lb

## 2022-11-27 DIAGNOSIS — R41840 Attention and concentration deficit: Secondary | ICD-10-CM | POA: Diagnosis not present

## 2022-11-27 DIAGNOSIS — Z79899 Other long term (current) drug therapy: Secondary | ICD-10-CM | POA: Diagnosis not present

## 2022-11-27 DIAGNOSIS — R232 Flushing: Secondary | ICD-10-CM | POA: Diagnosis not present

## 2022-11-27 DIAGNOSIS — Z8639 Personal history of other endocrine, nutritional and metabolic disease: Secondary | ICD-10-CM

## 2022-11-27 DIAGNOSIS — R7989 Other specified abnormal findings of blood chemistry: Secondary | ICD-10-CM | POA: Diagnosis not present

## 2022-11-27 DIAGNOSIS — E663 Overweight: Secondary | ICD-10-CM | POA: Diagnosis not present

## 2022-11-27 DIAGNOSIS — Z131 Encounter for screening for diabetes mellitus: Secondary | ICD-10-CM | POA: Diagnosis not present

## 2022-11-27 DIAGNOSIS — R0989 Other specified symptoms and signs involving the circulatory and respiratory systems: Secondary | ICD-10-CM

## 2022-11-27 DIAGNOSIS — R030 Elevated blood-pressure reading, without diagnosis of hypertension: Secondary | ICD-10-CM

## 2022-11-27 DIAGNOSIS — Z0000 Encounter for general adult medical examination without abnormal findings: Secondary | ICD-10-CM | POA: Diagnosis not present

## 2022-11-27 DIAGNOSIS — R748 Abnormal levels of other serum enzymes: Secondary | ICD-10-CM

## 2022-11-27 DIAGNOSIS — F410 Panic disorder [episodic paroxysmal anxiety] without agoraphobia: Secondary | ICD-10-CM

## 2022-11-27 DIAGNOSIS — F411 Generalized anxiety disorder: Secondary | ICD-10-CM

## 2022-11-27 DIAGNOSIS — R1011 Right upper quadrant pain: Secondary | ICD-10-CM

## 2022-11-27 MED ORDER — BUPROPION HCL ER (XL) 300 MG PO TB24: 300.0000 mg | ORAL_TABLET | Freq: Every day | ORAL | 3 refills | Status: DC

## 2022-11-27 MED ORDER — BUSPIRONE HCL 5 MG PO TABS: 5.0000 mg | ORAL_TABLET | Freq: Three times a day (TID) | ORAL | 3 refills | Status: DC

## 2022-11-27 MED ORDER — FLUTICASONE PROPIONATE 50 MCG/ACT NA SUSP: NASAL | 3 refills | Status: AC

## 2022-11-27 MED ORDER — WEGOVY 2.4 MG/0.75ML ~~LOC~~ SOAJ: 2.4000 mg | SUBCUTANEOUS | 3 refills | Status: DC

## 2022-11-27 NOTE — Progress Notes (Signed)
Established Patient Office Visit  Subjective   Patient ID: April Jarvis, female    DOB: 01/15/1981  Age: 41 y.o. MRN: [id number]  Chief Complaint  Patient presents with   Follow-up    HPI Pt is a 41 yo female who has hx of obesity and on wegovy who needs refills.   She has lost another 11lbs in 6 months. She is active and feels good. She has made lots of diet changes and seems to have more energy.   She continues to have some hot flash like symptoms. Denies any CP, palpitations, headaches or vision changes. She has been having some elevated blood pressure readings at home. Seembs to be since she start birth control.   She is having some right upper quadrant pain for the last few months. Sodas seem to make worse. No other foods that trigger symptoms. No reflux symptoms. Denies any melena or hematochezia. She drinks some alcohol but more social that daily. She really can't drink a lot with wegovy.   .. Active Ambulatory Problems    Diagnosis Date Noted   OTHER MONONEURITIS OF LOWER LIMB 10/21/2008   NUMBNESS 10/21/2008   PERONEAL NEUROPATHY 11/04/2008   Overweight (BMI 25.0-29.9) 02/28/2015   Hypertriglyceridemia 02/28/2015   Inattention 02/28/2015   Abnormal weight gain 04/01/2015   Hyperlipidemia 10/26/2016   Elevated LDL cholesterol level 12/04/2017   Obesity (BMI 30-39.9) 12/04/2017   Numbness and tingling in both hands 04/21/2021   Elevated liver enzymes 06/14/2021   Elevated TSH 06/14/2021   Class 1 obesity due to excess calories without serious comorbidity with body mass index (BMI) of 31.0 to 31.9 in adult 06/23/2021   Panic attacks 06/23/2021   Grief 11/21/2021   GAD (generalized anxiety disorder) 05/28/2022   Atypical chest pain 05/28/2022   Flushing 05/28/2022   Gallstones 12/05/2022   Left kidney mass 12/05/2022   Liver mass, right lobe 12/05/2022   History of obesity 12/11/2022   Elevated blood pressure reading 12/11/2022   Right upper quadrant pain  12/11/2022   Resolved Ambulatory Problems    Diagnosis Date Noted   Ankle sprain 02/28/2015   Lateral epicondylitis of left elbow 12/04/2017   No Additional Past Medical History     ROS See HPI   Objective:     BP 134/86   Pulse 72   Ht _0  (1.676 m)   Wt 155 lb (70.3 kg)   SpO2 100%   BMI 25.02 kg/m  BP Readings from Last 3 Encounters:  11/27/22 134/86  05/28/22 129/88  01/09/22 138/88   Wt Readings from Last 3 Encounters:  11/27/22 155 lb (70.3 kg)  05/28/22 167 lb (75.8 kg)  01/09/22 186 lb (84.4 kg)    .Marland Kitchen Results for orders placed or performed in visit on 05/28/22  COMPLETE METABOLIC PANEL WITH GFR  Result Value Ref Range   Glucose, Bld 80 65 - 99 mg/dL   BUN 13 7 - 25 mg/dL   Creat 1.05 (H) 0.50 - 0.99 mg/dL   eGFR 68 > OR = 60 mL/min/1.64m   BUN/Creatinine Ratio 12 6 - 22 (calc)   Sodium 137 135 - 146 mmol/L   Potassium 4.3 3.5 - 5.3 mmol/L   Chloride 102 98 - 110 mmol/L   CO2 24 20 - 32 mmol/L   Calcium 9.8 8.6 - 10.2 mg/dL   Total Protein 7.7 6.1 - 8.1 g/dL   Albumin 4.5 3.6 - 5.1 g/dL   Globulin 3.2 1.9 - 3.7 g/dL (calc)  AG Ratio 1.4 1.0 - 2.5 (calc)   Total Bilirubin 0.6 0.2 - 1.2 mg/dL   Alkaline phosphatase (APISO) 43 31 - 125 U/L   AST 28 10 - 30 U/L   ALT 50 (H) 6 - 29 U/L  TSH  Result Value Ref Range   TSH 3.21 mIU/L  Lipid Panel w/reflex Direct LDL  Result Value Ref Range   Cholesterol 193 <200 mg/dL   HDL 85 > OR = 50 mg/dL   Triglycerides 85 <150 mg/dL   LDL Cholesterol (Calc) 90 mg/dL (calc)   Total CHOL/HDL Ratio 2.3 <5.0 (calc)   Non-HDL Cholesterol (Calc) 108 <130 mg/dL (calc)     Physical Exam Constitutional:      Appearance: Normal appearance.  HENT:     Head: Normocephalic.     Nose: Nose normal.     Mouth/Throat:     Mouth: Mucous membranes are moist.     Pharynx: No oropharyngeal exudate or posterior oropharyngeal erythema.  Neck:     Vascular: No carotid bruit.  Cardiovascular:     Rate and Rhythm:  Normal rate and regular rhythm.     Pulses: Normal pulses.     Heart sounds: Normal heart sounds.  Pulmonary:     Effort: Pulmonary effort is normal.  Musculoskeletal:     Cervical back: Normal range of motion and neck supple. No rigidity or tenderness.     Right lower leg: No edema.     Left lower leg: No edema.  Lymphadenopathy:     Cervical: No cervical adenopathy.  Neurological:     General: No focal deficit present.     Mental Status: She is alert and oriented to person, place, and time.  Psychiatric:        Mood and Affect: Mood normal.     Assessment & Plan:  Marland KitchenMarland KitchenAlyze was seen today for follow-up.  Diagnoses and all orders for this visit:  Overweight (BMI 25.0-29.9) -     Semaglutide-Weight Management (WEGOVY) 2.4 MG/0.75ML SOAJ; Inject 2.4 mg into the skin once a week. Appt for refills  Chronic sniffling -     fluticasone (FLONASE) 50 MCG/ACT nasal spray; SPRAY 2 SPRAYS INTO EACH NOSTRIL EVERY DAY  Inattention -     buPROPion (WELLBUTRIN XL) 300 MG 24 hr tablet; Take 1 tablet (300 mg total) by mouth daily.  Flushing -     busPIRone (BUSPAR) 5 MG tablet; Take 1 tablet (5 mg total) by mouth 3 (three) times daily.  Panic attacks -     busPIRone (BUSPAR) 5 MG tablet; Take 1 tablet (5 mg total) by mouth 3 (three) times daily.  GAD (generalized anxiety disorder) -     busPIRone (BUSPAR) 5 MG tablet; Take 1 tablet (5 mg total) by mouth 3 (three) times daily.  Right upper quadrant pain -     US Abdomen Complete; Future  History of obesity  Elevated blood pressure reading   Continue wegovy. Weight looks great and would like to see your BMI be below 25.   Continue wellbutrin for focus and appetite control  Buspar for anxiety GAD numbers did improve  Elevated BP- stop OCP and recheck BP in 4 weeks.  Will check hormones once off junel  [place] ordered for right upper quadrant Labs ordered to recheck liver enzymes and kidney function in the next 1-2  weeks   Iran Planas, PA-C

## 2022-11-27 NOTE — Patient Instructions (Signed)
Stop Junel.  Check hormones in 2 months.  Will get ultrasound of abdomen.

## 2022-11-28 ENCOUNTER — Encounter: Payer: Self-pay | Admitting: Physician Assistant

## 2022-11-28 ENCOUNTER — Other Ambulatory Visit: Payer: Self-pay | Admitting: Physician Assistant

## 2022-11-28 DIAGNOSIS — Z1231 Encounter for screening mammogram for malignant neoplasm of breast: Secondary | ICD-10-CM

## 2022-11-28 LAB — LIPID PANEL W/REFLEX DIRECT LDL
Cholesterol: 193 mg/dL (ref <200)
HDL: 85 mg/dL (ref >=50)
LDL Cholesterol (Calc): 90 mg/dL (calc)
Non-HDL Cholesterol (Calc): 108 mg/dL (calc) (ref <130)
Total CHOL/HDL Ratio: 2.3 (calc) (ref <5.0)
Triglycerides: 85 mg/dL (ref <150)

## 2022-11-28 LAB — TSH: TSH: 3.21 mIU/L

## 2022-11-28 LAB — COMPLETE METABOLIC PANEL WITH GFR
AG Ratio: 1.4 (calc) (ref 1.0–2.5)
ALT: 50 U/L — ABNORMAL HIGH (ref 6–29)
AST: 28 U/L (ref 10–30)
Albumin: 4.5 g/dL (ref 3.6–5.1)
Alkaline phosphatase (APISO): 43 U/L (ref 31–125)
BUN/Creatinine Ratio: 12 (calc) (ref 6–22)
BUN: 13 mg/dL (ref 7–25)
CO2: 24 mmol/L (ref 20–32)
Calcium: 9.8 mg/dL (ref 8.6–10.2)
Chloride: 102 mmol/L (ref 98–110)
Creat: 1.05 mg/dL — ABNORMAL HIGH (ref 0.50–0.99)
Globulin: 3.2 g/dL (calc) (ref 1.9–3.7)
Glucose, Bld: 80 mg/dL (ref 65–99)
Potassium: 4.3 mmol/L (ref 3.5–5.3)
Sodium: 137 mmol/L (ref 135–146)
Total Bilirubin: 0.6 mg/dL (ref 0.2–1.2)
Total Protein: 7.7 g/dL (ref 6.1–8.1)
eGFR: 68 mL/min/{1.73_m2} (ref >=60)

## 2022-11-28 NOTE — Progress Notes (Signed)
Swayzie,   Cholesterol looks much better.  Thyroid looks good.  Kidney function dropped some. We need to watch this. Make sure drinking enough water, avoid NSAIDs and recheck in 2-4 weeks.  Your ALT up some too. Had gone down. Are you drinking more alcohol or taking more tylenol?

## 2022-11-29 ENCOUNTER — Other Ambulatory Visit: Payer: Self-pay | Admitting: Neurology

## 2022-11-29 DIAGNOSIS — R748 Abnormal levels of other serum enzymes: Secondary | ICD-10-CM

## 2022-12-04 ENCOUNTER — Ambulatory Visit (INDEPENDENT_AMBULATORY_CARE_PROVIDER_SITE_OTHER): Payer: BC Managed Care – PPO

## 2022-12-04 DIAGNOSIS — K8020 Calculus of gallbladder without cholecystitis without obstruction: Secondary | ICD-10-CM | POA: Diagnosis not present

## 2022-12-04 DIAGNOSIS — R1011 Right upper quadrant pain: Secondary | ICD-10-CM | POA: Diagnosis not present

## 2022-12-05 ENCOUNTER — Other Ambulatory Visit: Payer: Self-pay | Admitting: Physician Assistant

## 2022-12-05 DIAGNOSIS — R160 Hepatomegaly, not elsewhere classified: Secondary | ICD-10-CM

## 2022-12-05 DIAGNOSIS — K8020 Calculus of gallbladder without cholecystitis without obstruction: Secondary | ICD-10-CM | POA: Insufficient documentation

## 2022-12-05 DIAGNOSIS — K769 Liver disease, unspecified: Secondary | ICD-10-CM

## 2022-12-05 DIAGNOSIS — N2889 Other specified disorders of kidney and ureter: Secondary | ICD-10-CM

## 2022-12-05 NOTE — Progress Notes (Signed)
April Jarvis, You do have gallstones biggest 57m. No acute inflammation or infection of gallbladder.  2 liver lesions noted.  You also have left kidney mass that could be cyst or solid.   Will need to get MR of abdomen for better clarification.

## 2022-12-11 ENCOUNTER — Other Ambulatory Visit: Payer: BC Managed Care – PPO

## 2022-12-11 DIAGNOSIS — R030 Elevated blood-pressure reading, without diagnosis of hypertension: Secondary | ICD-10-CM | POA: Insufficient documentation

## 2022-12-11 DIAGNOSIS — R7989 Other specified abnormal findings of blood chemistry: Secondary | ICD-10-CM | POA: Insufficient documentation

## 2022-12-11 DIAGNOSIS — Z8639 Personal history of other endocrine, nutritional and metabolic disease: Secondary | ICD-10-CM | POA: Insufficient documentation

## 2022-12-11 DIAGNOSIS — R1011 Right upper quadrant pain: Secondary | ICD-10-CM | POA: Insufficient documentation

## 2022-12-18 ENCOUNTER — Encounter: Payer: Self-pay | Admitting: Physician Assistant

## 2022-12-18 ENCOUNTER — Ambulatory Visit: Payer: BC Managed Care – PPO

## 2022-12-18 DIAGNOSIS — N281 Cyst of kidney, acquired: Secondary | ICD-10-CM | POA: Diagnosis not present

## 2022-12-18 DIAGNOSIS — K769 Liver disease, unspecified: Secondary | ICD-10-CM

## 2022-12-18 DIAGNOSIS — N2889 Other specified disorders of kidney and ureter: Secondary | ICD-10-CM

## 2022-12-18 DIAGNOSIS — K8020 Calculus of gallbladder without cholecystitis without obstruction: Secondary | ICD-10-CM | POA: Diagnosis not present

## 2022-12-18 MED ORDER — GADOBUTROL 1 MMOL/ML IV SOLN
7.0000 mL | Freq: Once | INTRAVENOUS | Status: AC | PRN
  Administered 2022-12-18: 7 mL via INTRAVENOUS

## 2022-12-18 NOTE — Progress Notes (Signed)
GREAT news. No suspicious hepatic or renal lesions.  You do continue to have stones in gallbladder but no signs of acute infection. It certainly could be causing your right upper quadrant pain and elevated liver enzymes. Are you ok with surgery consult?

## 2022-12-18 NOTE — Addendum Note (Signed)
Addended by: Donella Stade on: 12/18/2022 04:52 PM   Modules accepted: Orders

## 2022-12-21 DIAGNOSIS — L570 Actinic keratosis: Secondary | ICD-10-CM | POA: Diagnosis not present

## 2022-12-25 NOTE — Addendum Note (Signed)
Addended by: Donella Stade on: 12/25/2022 07:07 AM   Modules accepted: Orders

## 2023-01-14 ENCOUNTER — Ambulatory Visit: Payer: Self-pay | Admitting: Surgery

## 2023-01-14 DIAGNOSIS — K8010 Calculus of gallbladder with chronic cholecystitis without obstruction: Secondary | ICD-10-CM | POA: Diagnosis not present

## 2023-01-14 NOTE — H&P (Signed)
Subjective    Chief Complaint: New Consultation (gallstones)       History of Present Illness: April Jarvis is a 42 y.o. female who is seen today as an office consultation at the request of Dr. Alden Hipp for evaluation of New Consultation (gallstones) .   This is a 42 year old female in relatively good health who presents after a 45 pound weight loss over the last year.  This was intentional and was accomplished with the assistance of Wegovy.  The patient continues to take [facility].  She presents with a recent history of intermittent right upper quadrant abdominal pain.  She states that often this pain is exacerbated by drinking soda.  Occasionally it is exacerbated by eating meals.  She denies any other significant symptoms such as nausea, vomiting, diarrhea.  She underwent recent ultrasound that showed multiple gallstones without signs of acute cholecystitis.  She had a follow-up MRI to evaluate to possible right hepatic lesions.  MRI revealed no suspicious hepatic lesions and a benign left renal cyst.  The patient continues to have intermittent symptoms every few days.  She presents now to discuss elective cholecystectomy.   No previous abdominal surgery   Review of Systems: A complete review of systems was obtained from the patient.  I have reviewed this information and discussed as appropriate with the patient.  See HPI as well for other ROS.   Review of Systems  Constitutional: Negative.   HENT: Negative.    Eyes: Negative.   Respiratory: Negative.    Cardiovascular: Negative.   Gastrointestinal: Negative.   Genitourinary: Negative.   Musculoskeletal: Negative.   Skin: Negative.   Neurological: Negative.   Endo/Heme/Allergies: Negative.   Psychiatric/Behavioral: Negative.          Medical History: Past Medical History      Past Medical History:  Diagnosis Date   Anxiety             Patient Active Problem List  Diagnosis   Calculus of gallbladder with chronic  cholecystitis without obstruction      Past Surgical History  History reviewed. No pertinent surgical history.      Allergies  No Known Allergies           Current Outpatient Medications on File Prior to Visit  Medication Sig Dispense Refill   buPROPion (WELLBUTRIN XL) 300 MG XL tablet Take 1 tablet by mouth once daily       busPIRone (BUSPAR) 5 MG tablet Take 1 tablet by mouth 3 (three) times daily       fluticasone propionate (FLONASE) 50 mcg/actuation nasal spray Place 2 sprays into both nostrils once daily       LORazepam (ATIVAN) 0.5 MG tablet Take 0.5 mg by mouth 2 (two) times daily as needed       WEGOVY 2.4 mg/0.75 mL pen injector Inject 2.4 mg subcutaneously once a week        No current facility-administered medications on file prior to visit.      Family History       Family History  Problem Relation Age of Onset   Obesity Mother     Skin cancer Father     Stroke Father     Hyperlipidemia (Elevated cholesterol) Father     Diabetes Father          Social History        Tobacco Use  Smoking Status Some Days   Types: Cigarettes  Smokeless Tobacco Never      Social  History  Social History         Socioeconomic History   Marital status: Married  Tobacco Use   Smoking status: Some Days      Types: Cigarettes   Smokeless tobacco: Never  Substance and Sexual Activity   Alcohol use: Yes      Comment: 2-3   Drug use: Never        Objective:         Vitals:    01/14/23 1104  BP: 133/85  Pulse: 105  Temp: 36.9 C (98.4 F)  SpO2: 96%  Weight: 69.7 kg (153 lb 9.6 oz)  Height: 167.6 cm ('5\' 6"'$ )    Body mass index is 24.79 kg/m.   Physical Exam    Constitutional:  WDWN in NAD, conversant, no obvious deformities; lying in bed comfortably Eyes:  Pupils equal, round; sclera anicteric; moist conjunctiva; no lid lag HENT:  Oral mucosa moist; good dentition  Neck:  No masses palpated, trachea midline; no thyromegaly Lungs:  CTA bilaterally;  normal respiratory effort CV:  Regular rate and rhythm; no murmurs; extremities well-perfused with no edema Abd:  +bowel sounds, soft, non-tender, no palpable organomegaly; no palpable hernias Musc:  normal gait; no apparent clubbing or cyanosis in extremities Lymphatic:  No palpable cervical or axillary lymphadenopathy Skin:  Warm, dry; no sign of jaundice Psychiatric - alert and oriented x 4; calm mood and affect     Labs, Imaging and Diagnostic Testing:   LFTs showed a mildly elevated ALT but normal bilirubin. CLINICAL DATA:  Right upper abdominal pain for 2 months.   EXAM: ABDOMEN ULTRASOUND COMPLETE   COMPARISON:  None Available.   FINDINGS: Gallbladder: Gallstones measure up to 8 mm in size. No gallbladder wall thickening visualized. No sonographic Murphy sign noted by sonographer.   Common bile duct: Diameter: 5 mm   Liver: 2 nearby hyperechoic lesions in the right hepatic lobe measure 1.4 x 0.9 x 1.5 cm and 1.0 x 0.9 x 1.1 cm. Within normal limits in parenchymal echogenicity. Portal vein is patent on color Doppler imaging with normal direction of blood flow towards the liver.   IVC: No abnormality visualized.   Pancreas: Visualized portion unremarkable.   Spleen: Small in size.   Right Kidney: Length: 10.4 cm. Echogenicity within normal limits. No mass or hydronephrosis visualized.   Left Kidney: Length: 9.6 cm. Echogenicity within normal limits. A cyst in the lower pole of the left kidney measures 1.8 x 1.3 x 2.0 cm. An adjacent shadowing calcification measures 0.9 cm. A hypoechoic mass in the kidney measures 1.5 x 1.2 x 1.3 cm. No hydronephrosis visualized.   Abdominal aorta: No aneurysm visualized.   Other findings: None.   IMPRESSION: 1. Cholelithiasis without evidence of acute cholecystitis. 2. Two hyperechoic lesions in the right hepatic lobe. If the patient has no history of cancer or liver disease, then this is likely a hemangioma and benign.  Follow-up right upper quadrant ultrasound could be considered in 6-12 months. If the patient does have a history of cancer or liver disease, MR abdomen would be recommended. 3. Indeterminate hypoechoic mass in the lower pole of the left kidney may represent a complicated cyst versus solid mass. Recommend MR abdomen (according to a renal or liver protocol) for further evaluation. At that time, recommend attention to the liver lesions for further characterization.     Electronically Signed   By: Zerita Boers M.D.   On: 12/04/2022 15:00   CLINICAL DATA:  Further evaluation of  hepatic and renal lesions.   EXAM: MRI ABDOMEN WITHOUT AND WITH CONTRAST   TECHNIQUE: Multiplanar multisequence MR imaging of the abdomen was performed both before and after the administration of intravenous contrast.   CONTRAST:  49m GADAVIST GADOBUTROL 1 MMOL/ML IV SOLN   COMPARISON:  Ultrasound December 04, 2022.   FINDINGS: Lower chest: No acute abnormality.   Hepatobiliary: No suspicious hepatic lesion specifically no suspicious lesion in the right lobe of the liver to correspond with the findings on prior ultrasound. Cholelithiasis without findings of acute cholecystitis. No biliary ductal dilation.   Pancreas: No pancreatic ductal dilation or evidence of acute inflammation.   Spleen:  No splenomegaly.   Adrenals/Urinary Tract:  Bilateral adrenal glands appear normal.   No hydronephrosis.   Lesion in the left interpolar kidney is homogeneously T1 hyperintense measuring 15 mm on image 13/10 without suspicious postcontrast enhancement on subtraction imaging. Compatible with a benign hemorrhagic/proteinaceous renal cyst requiring no independent imaging follow-up.   Additional fluid signal left renal lesions compatible with cysts measure up to 9 mm compatible with cysts and considered benign requiring no independent imaging follow-up.   Stomach/Bowel: Visualized portions within the abdomen  are unremarkable.   Vascular/Lymphatic: No pathologically enlarged lymph nodes identified. No abdominal aortic aneurysm demonstrated.   Other: Heterogeneous stranding in the left anterior abdominal wall for instance on image 22/20 may reflect sequela of subcutaneous injection.   Musculoskeletal: No suspicious bone lesions identified.   IMPRESSION: 1. No suspicious hepatic lesion specifically no suspicious lesion in the right lobe of the liver to correspond with the findings on prior ultrasound. 2. Benign hemorrhagic/proteinaceous left renal cyst requiring no independent imaging follow-up. 3. Cholelithiasis without findings of acute cholecystitis. 4. Heterogeneous stranding in the left anterior abdominal wall may reflect sequela of subcutaneous injection.     Electronically Signed   By: JDahlia BailiffM.D.   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  Potential risks, benefits, alternative treatments, and expected outcomes have been explained.  All of the patient's questions at this time have been answered.  The likelihood of reaching the patient's treatment goal is good.  The patient understand the proposed surgical procedure and wishes to proceed.       Debborah Alonge KJearld Adjutant MD  01/14/2023 3:00 PM

## 2023-01-16 ENCOUNTER — Ambulatory Visit (INDEPENDENT_AMBULATORY_CARE_PROVIDER_SITE_OTHER): Payer: BC Managed Care – PPO

## 2023-01-16 DIAGNOSIS — Z1231 Encounter for screening mammogram for malignant neoplasm of breast: Secondary | ICD-10-CM | POA: Diagnosis not present

## 2023-01-18 NOTE — Progress Notes (Signed)
Normal mammogram. Follow up in 1 year.

## 2023-01-24 DIAGNOSIS — Z01419 Encounter for gynecological examination (general) (routine) without abnormal findings: Secondary | ICD-10-CM | POA: Diagnosis not present

## 2023-01-24 DIAGNOSIS — Z6823 Body mass index (BMI) 23.0-23.9, adult: Secondary | ICD-10-CM | POA: Diagnosis not present

## 2023-01-28 ENCOUNTER — Encounter: Payer: Self-pay | Admitting: Physician Assistant

## 2023-01-28 DIAGNOSIS — F410 Panic disorder [episodic paroxysmal anxiety] without agoraphobia: Secondary | ICD-10-CM

## 2023-01-28 MED ORDER — LORAZEPAM 0.5 MG PO TABS: 0.5000 mg | ORAL_TABLET | Freq: Two times a day (BID) | ORAL | 2 refills | Status: DC | PRN

## 2023-02-26 ENCOUNTER — Ambulatory Visit: Payer: BC Managed Care – PPO | Admitting: Physician Assistant

## 2023-03-04 ENCOUNTER — Ambulatory Visit: Payer: BC Managed Care – PPO | Admitting: Physician Assistant

## 2023-03-22 DIAGNOSIS — O09291 Supervision of pregnancy with other poor reproductive or obstetric history, first trimester: Secondary | ICD-10-CM | POA: Diagnosis not present

## 2023-03-22 DIAGNOSIS — O09521 Supervision of elderly multigravida, first trimester: Secondary | ICD-10-CM | POA: Diagnosis not present

## 2023-03-22 DIAGNOSIS — Z3481 Encounter for supervision of other normal pregnancy, first trimester: Secondary | ICD-10-CM | POA: Diagnosis not present

## 2023-03-22 DIAGNOSIS — Z77011 Contact with and (suspected) exposure to lead: Secondary | ICD-10-CM | POA: Diagnosis not present

## 2023-03-22 DIAGNOSIS — Z114 Encounter for screening for human immunodeficiency virus [HIV]: Secondary | ICD-10-CM | POA: Diagnosis not present

## 2023-03-25 NOTE — Pre-Procedure Instructions (Signed)
Surgical Instructions    Your procedure is scheduled on Tuesday, April 02, 2023.  Report to [facility] Main Entrance "A" at 5:30 A.M., then check in with the Admitting office.  Call this number if you have problems the morning of surgery:  [phone]   If you have any questions prior to your surgery date call [phone]: Open Monday-Friday 8am-4pm If you experience any cold or flu symptoms such as cough, fever, chills, shortness of breath, etc. between now and your scheduled surgery, please notify us at the above number     Remember:  Do not eat after midnight the night before your surgery  You may drink clear liquids until 4:30 am the morning of your surgery.   Clear liquids allowed are: Water, Non-Citrus Juices (without pulp), Carbonated Beverages, Clear Tea, Black Coffee ONLY (NO MILK, CREAM OR POWDERED CREAMER of any kind), and Gatorade    Take these medicines the morning of surgery with A SIP OF WATER:   buPROPion (WELLBUTRIN)  busPIRone (BUSPAR)   LORazepam (ATIVAN) -if needed  fluticasone (FLONASE) -if needed  Please stop taking Semaglutide-Weight Management (WEGOVY) 7 days prior to your surgery.  As of today, STOP taking any Aspirin (unless otherwise instructed by your surgeon) Aleve, Naproxen, Ibuprofen, Motrin, Advil, Goody's, BC's, all herbal medications, fish oil, and all vitamins.           Do not wear jewelry or makeup. Do not wear lotions, powders, perfumes or deodorant. Do not shave 48 hours prior to surgery.   Do not bring valuables to the hospital. Do not wear nail polish, gel polish, artificial nails, or any other type of covering on natural nails (fingers and toes) If you have artificial nails or gel coating that need to be removed by a nail salon, please have this removed prior to surgery. Artificial nails or gel coating may interfere with anesthesia's ability to adequately monitor your vital signs.  [facility] is not responsible for any belongings or  valuables.    Do NOT Smoke (Tobacco/Vaping)  24 hours prior to your procedure  If you use a CPAP at night, you may bring your mask for your overnight stay.   Contacts, glasses, hearing aids, dentures or partials may not be worn into surgery, please bring cases for these belongings   For patients admitted to the hospital, discharge time will be determined by your treatment team.   Patients discharged the day of surgery will not be allowed to drive home, and someone needs to stay with them for 24 hours.   SURGICAL WAITING ROOM VISITATION Patients having surgery or a procedure may have no more than 2 support people in the waiting area - these visitors may rotate.   Children under the age of 18 must have an adult with them who is not the patient. If the patient needs to stay at the hospital during part of their recovery, the visitor guidelines for inpatient rooms apply. Pre-op nurse will coordinate an appropriate time for 1 support person to accompany patient in pre-op.  This support person may not rotate.   Please refer to [email or web address] for the visitor guidelines for Inpatients (after your surgery is over and you are in a regular room).    Special instructions:    Oral Hygiene is also important to reduce your risk of infection.  Remember - BRUSH YOUR TEETH THE MORNING OF SURGERY WITH YOUR REGULAR TOOTHPASTE   [facility]- Preparing For Surgery  Before surgery, you can play an important role.  Because skin is not sterile, your skin needs to be as free of germs as possible. You can reduce the number of germs on your skin by washing with CHG (chlorahexidine gluconate) Soap before surgery.  CHG is an antiseptic cleaner which kills germs and bonds with the skin to continue killing germs even after washing.     Please do not use if you have an allergy to CHG or antibacterial soaps. If your skin becomes reddened/irritated stop using the  CHG.  Do not shave (including legs and underarms) for at least 48 hours prior to first CHG shower. It is OK to shave your face.  Please follow these instructions carefully.     Shower the NIGHT BEFORE SURGERY and the MORNING OF SURGERY with CHG Soap.   If you chose to wash your hair, wash your hair first as usual with your normal shampoo. After you shampoo, rinse your hair and body thoroughly to remove the shampoo.  Then [employer] and genitals (private parts) with your normal soap and rinse thoroughly to remove soap.  After that Use CHG Soap as you would any other liquid soap. You can apply CHG directly to the skin and wash gently with a scrungie or a clean washcloth.   Apply the CHG Soap to your body ONLY FROM THE NECK DOWN.  Do not use on open wounds or open sores. Avoid contact with your eyes, ears, mouth and genitals (private parts). Wash Face and genitals (private parts)  with your normal soap.   Wash thoroughly, paying special attention to the area where your surgery will be performed.  Thoroughly rinse your body with warm water from the neck down.  DO NOT shower/wash with your normal soap after using and rinsing off the CHG Soap.  Pat yourself dry with a CLEAN TOWEL.  Wear CLEAN PAJAMAS to bed the night before surgery  Place CLEAN SHEETS on your bed the night before your surgery  DO NOT SLEEP WITH PETS.   Day of Surgery:  Take a shower with CHG soap. Wear Clean/Comfortable clothing the morning of surgery Do not apply any deodorants/lotions.   Remember to brush your teeth WITH YOUR REGULAR TOOTHPASTE.    If you received a COVID test during your pre-op visit, it is requested that you wear a mask when out in public, stay away from anyone that may not be feeling well, and notify your surgeon if you develop symptoms. If you have been in contact with anyone that has tested positive in the last 10 days, please notify your surgeon.    Please read over the following fact sheets  that you were given.

## 2023-03-26 ENCOUNTER — Inpatient Hospital Stay (HOSPITAL_COMMUNITY)
Admission: RE | Admit: 2023-03-26 | Discharge: 2023-03-26 | Disposition: A | Discharge status: Home / Self Care | Payer: BC Managed Care – PPO | Source: Ambulatory Visit

## 2023-04-02 ENCOUNTER — Ambulatory Visit (HOSPITAL_COMMUNITY): Admission: RE | Admit: 2023-04-02 | Payer: BC Managed Care – PPO | Source: Home / Self Care | Admitting: Surgery

## 2023-04-02 ENCOUNTER — Encounter: Payer: Self-pay | Admitting: Physician Assistant

## 2023-04-02 ENCOUNTER — Encounter (HOSPITAL_COMMUNITY): Admission: RE | Payer: Self-pay | Source: Home / Self Care

## 2023-04-02 SURGERY — LAPAROSCOPIC CHOLECYSTECTOMY WITH INTRAOPERATIVE CHOLANGIOGRAM
Anesthesia: General

## 2023-04-05 ENCOUNTER — Telehealth: Payer: Self-pay

## 2023-04-05 NOTE — Telephone Encounter (Signed)
Initiated Prior authorization [id number] 2.4MG /0.75ML auto-injectors Via: Covermymeds Case/Key:[id number] Status: approved as of 04/05/23 Reason:Authorization Expiration Date: November 01, 2023. Notified Pt via: Mychart

## 2023-04-17 DIAGNOSIS — Z1331 Encounter for screening for depression: Secondary | ICD-10-CM | POA: Diagnosis not present

## 2023-04-17 DIAGNOSIS — O26619 Liver and biliary tract disorders in pregnancy, unspecified trimester: Secondary | ICD-10-CM | POA: Diagnosis not present

## 2023-04-17 DIAGNOSIS — Z3480 Encounter for supervision of other normal pregnancy, unspecified trimester: Secondary | ICD-10-CM | POA: Diagnosis not present

## 2023-04-17 DIAGNOSIS — O09529 Supervision of elderly multigravida, unspecified trimester: Secondary | ICD-10-CM | POA: Diagnosis not present

## 2023-04-17 DIAGNOSIS — D259 Leiomyoma of uterus, unspecified: Secondary | ICD-10-CM | POA: Diagnosis not present

## 2023-05-30 DIAGNOSIS — O43122 Velamentous insertion of umbilical cord, second trimester: Secondary | ICD-10-CM | POA: Diagnosis not present

## 2023-05-30 DIAGNOSIS — O43102 Malformation of placenta, unspecified, second trimester: Secondary | ICD-10-CM | POA: Diagnosis not present

## 2023-05-30 DIAGNOSIS — O3412 Maternal care for benign tumor of corpus uteri, second trimester: Secondary | ICD-10-CM | POA: Diagnosis not present

## 2023-05-30 DIAGNOSIS — O359XX0 Maternal care for (suspected) fetal abnormality and damage, unspecified, not applicable or unspecified: Secondary | ICD-10-CM | POA: Diagnosis not present

## 2023-06-21 DIAGNOSIS — Z6824 Body mass index (BMI) 24.0-24.9, adult: Secondary | ICD-10-CM | POA: Diagnosis not present

## 2023-06-21 DIAGNOSIS — O09899 Supervision of other high risk pregnancies, unspecified trimester: Secondary | ICD-10-CM | POA: Diagnosis not present

## 2023-06-21 DIAGNOSIS — Z3480 Encounter for supervision of other normal pregnancy, unspecified trimester: Secondary | ICD-10-CM | POA: Diagnosis not present

## 2023-06-21 DIAGNOSIS — O09529 Supervision of elderly multigravida, unspecified trimester: Secondary | ICD-10-CM | POA: Diagnosis not present

## 2023-07-04 DIAGNOSIS — O283 Abnormal ultrasonic finding on antenatal screening of mother: Secondary | ICD-10-CM | POA: Diagnosis not present

## 2023-07-04 DIAGNOSIS — O43102 Malformation of placenta, unspecified, second trimester: Secondary | ICD-10-CM | POA: Diagnosis not present

## 2023-07-04 DIAGNOSIS — Z3A23 23 weeks gestation of pregnancy: Secondary | ICD-10-CM | POA: Diagnosis not present

## 2023-07-04 DIAGNOSIS — O43122 Velamentous insertion of umbilical cord, second trimester: Secondary | ICD-10-CM | POA: Diagnosis not present

## 2023-07-12 DIAGNOSIS — L578 Other skin changes due to chronic exposure to nonionizing radiation: Secondary | ICD-10-CM | POA: Diagnosis not present

## 2023-07-12 DIAGNOSIS — L814 Other melanin hyperpigmentation: Secondary | ICD-10-CM | POA: Diagnosis not present

## 2023-07-19 DIAGNOSIS — Z3480 Encounter for supervision of other normal pregnancy, unspecified trimester: Secondary | ICD-10-CM | POA: Diagnosis not present

## 2023-07-19 DIAGNOSIS — D259 Leiomyoma of uterus, unspecified: Secondary | ICD-10-CM | POA: Diagnosis not present

## 2023-07-19 DIAGNOSIS — O09899 Supervision of other high risk pregnancies, unspecified trimester: Secondary | ICD-10-CM | POA: Diagnosis not present

## 2023-07-19 DIAGNOSIS — O09529 Supervision of elderly multigravida, unspecified trimester: Secondary | ICD-10-CM | POA: Diagnosis not present

## 2023-08-02 DIAGNOSIS — Z3A27 27 weeks gestation of pregnancy: Secondary | ICD-10-CM | POA: Diagnosis not present

## 2023-08-02 DIAGNOSIS — O35EXX0 Maternal care for other (suspected) fetal abnormality and damage, fetal genitourinary anomalies, not applicable or unspecified: Secondary | ICD-10-CM | POA: Diagnosis not present

## 2023-08-20 DIAGNOSIS — O43129 Velamentous insertion of umbilical cord, unspecified trimester: Secondary | ICD-10-CM | POA: Diagnosis not present

## 2023-08-20 DIAGNOSIS — Z3480 Encounter for supervision of other normal pregnancy, unspecified trimester: Secondary | ICD-10-CM | POA: Diagnosis not present

## 2023-08-20 DIAGNOSIS — D259 Leiomyoma of uterus, unspecified: Secondary | ICD-10-CM | POA: Diagnosis not present

## 2023-08-20 DIAGNOSIS — Z23 Encounter for immunization: Secondary | ICD-10-CM | POA: Diagnosis not present

## 2023-08-30 DIAGNOSIS — O43103 Malformation of placenta, unspecified, third trimester: Secondary | ICD-10-CM | POA: Diagnosis not present

## 2023-08-30 DIAGNOSIS — O43123 Velamentous insertion of umbilical cord, third trimester: Secondary | ICD-10-CM | POA: Diagnosis not present

## 2023-09-12 DIAGNOSIS — O35EXX0 Maternal care for other (suspected) fetal abnormality and damage, fetal genitourinary anomalies, not applicable or unspecified: Secondary | ICD-10-CM | POA: Diagnosis not present

## 2023-09-12 DIAGNOSIS — O43123 Velamentous insertion of umbilical cord, third trimester: Secondary | ICD-10-CM | POA: Diagnosis not present

## 2023-09-12 DIAGNOSIS — D259 Leiomyoma of uterus, unspecified: Secondary | ICD-10-CM | POA: Diagnosis not present

## 2023-09-12 DIAGNOSIS — O3412 Maternal care for benign tumor of corpus uteri, second trimester: Secondary | ICD-10-CM | POA: Diagnosis not present

## 2023-09-20 DIAGNOSIS — O35EXX0 Maternal care for other (suspected) fetal abnormality and damage, fetal genitourinary anomalies, not applicable or unspecified: Secondary | ICD-10-CM | POA: Diagnosis not present

## 2023-09-20 DIAGNOSIS — Z3A34 34 weeks gestation of pregnancy: Secondary | ICD-10-CM | POA: Diagnosis not present

## 2023-09-27 DIAGNOSIS — D259 Leiomyoma of uterus, unspecified: Secondary | ICD-10-CM | POA: Diagnosis not present

## 2023-09-27 DIAGNOSIS — O3663X0 Maternal care for excessive fetal growth, third trimester, not applicable or unspecified: Secondary | ICD-10-CM | POA: Diagnosis not present

## 2023-09-27 DIAGNOSIS — O321XX0 Maternal care for breech presentation, not applicable or unspecified: Secondary | ICD-10-CM | POA: Diagnosis not present

## 2023-09-27 DIAGNOSIS — O43123 Velamentous insertion of umbilical cord, third trimester: Secondary | ICD-10-CM | POA: Diagnosis not present

## 2023-09-27 DIAGNOSIS — O43103 Malformation of placenta, unspecified, third trimester: Secondary | ICD-10-CM | POA: Diagnosis not present

## 2023-10-01 DIAGNOSIS — Z3685 Encounter for antenatal screening for Streptococcus B: Secondary | ICD-10-CM | POA: Diagnosis not present

## 2023-10-01 DIAGNOSIS — Z3480 Encounter for supervision of other normal pregnancy, unspecified trimester: Secondary | ICD-10-CM | POA: Diagnosis not present

## 2023-10-04 DIAGNOSIS — D259 Leiomyoma of uterus, unspecified: Secondary | ICD-10-CM | POA: Diagnosis not present

## 2023-10-04 DIAGNOSIS — O3663X0 Maternal care for excessive fetal growth, third trimester, not applicable or unspecified: Secondary | ICD-10-CM | POA: Diagnosis not present

## 2023-10-04 DIAGNOSIS — O321XX0 Maternal care for breech presentation, not applicable or unspecified: Secondary | ICD-10-CM | POA: Diagnosis not present

## 2023-10-07 DIAGNOSIS — O321XX0 Maternal care for breech presentation, not applicable or unspecified: Secondary | ICD-10-CM | POA: Diagnosis not present

## 2023-10-07 DIAGNOSIS — Z87891 Personal history of nicotine dependence: Secondary | ICD-10-CM | POA: Diagnosis not present

## 2023-10-07 DIAGNOSIS — O329XX0 Maternal care for malpresentation of fetus, unspecified, not applicable or unspecified: Secondary | ICD-10-CM | POA: Diagnosis not present

## 2023-10-07 DIAGNOSIS — O09523 Supervision of elderly multigravida, third trimester: Secondary | ICD-10-CM | POA: Diagnosis not present

## 2023-10-07 DIAGNOSIS — O403XX0 Polyhydramnios, third trimester, not applicable or unspecified: Secondary | ICD-10-CM | POA: Diagnosis not present

## 2023-10-07 DIAGNOSIS — Z3A37 37 weeks gestation of pregnancy: Secondary | ICD-10-CM | POA: Diagnosis not present

## 2023-10-08 DIAGNOSIS — Z3A37 37 weeks gestation of pregnancy: Secondary | ICD-10-CM | POA: Diagnosis not present

## 2023-10-08 DIAGNOSIS — Z3480 Encounter for supervision of other normal pregnancy, unspecified trimester: Secondary | ICD-10-CM | POA: Diagnosis not present

## 2023-10-08 DIAGNOSIS — O403XX0 Polyhydramnios, third trimester, not applicable or unspecified: Secondary | ICD-10-CM | POA: Diagnosis not present

## 2023-10-08 DIAGNOSIS — O321XX0 Maternal care for breech presentation, not applicable or unspecified: Secondary | ICD-10-CM | POA: Diagnosis not present

## 2023-10-10 DIAGNOSIS — Z3A37 37 weeks gestation of pregnancy: Secondary | ICD-10-CM | POA: Diagnosis not present

## 2023-10-10 DIAGNOSIS — D259 Leiomyoma of uterus, unspecified: Secondary | ICD-10-CM | POA: Diagnosis not present

## 2023-10-10 DIAGNOSIS — O403XX0 Polyhydramnios, third trimester, not applicable or unspecified: Secondary | ICD-10-CM | POA: Diagnosis not present

## 2023-10-10 DIAGNOSIS — O3413 Maternal care for benign tumor of corpus uteri, third trimester: Secondary | ICD-10-CM | POA: Diagnosis not present

## 2023-10-15 DIAGNOSIS — Z3480 Encounter for supervision of other normal pregnancy, unspecified trimester: Secondary | ICD-10-CM | POA: Diagnosis not present

## 2023-10-16 DIAGNOSIS — Z3A38 38 weeks gestation of pregnancy: Secondary | ICD-10-CM | POA: Diagnosis not present

## 2023-10-16 DIAGNOSIS — O3413 Maternal care for benign tumor of corpus uteri, third trimester: Secondary | ICD-10-CM | POA: Diagnosis not present

## 2023-10-16 DIAGNOSIS — D259 Leiomyoma of uterus, unspecified: Secondary | ICD-10-CM | POA: Diagnosis not present

## 2023-10-21 DIAGNOSIS — O368130 Decreased fetal movements, third trimester, not applicable or unspecified: Secondary | ICD-10-CM | POA: Diagnosis not present

## 2023-10-21 DIAGNOSIS — O409XX0 Polyhydramnios, unspecified trimester, not applicable or unspecified: Secondary | ICD-10-CM | POA: Diagnosis not present

## 2023-10-21 DIAGNOSIS — O321XX0 Maternal care for breech presentation, not applicable or unspecified: Secondary | ICD-10-CM | POA: Diagnosis not present

## 2023-10-21 DIAGNOSIS — O9852 Other viral diseases complicating childbirth: Secondary | ICD-10-CM | POA: Diagnosis not present

## 2023-10-21 DIAGNOSIS — O328XX0 Maternal care for other malpresentation of fetus, not applicable or unspecified: Secondary | ICD-10-CM | POA: Diagnosis not present

## 2023-10-21 DIAGNOSIS — F419 Anxiety disorder, unspecified: Secondary | ICD-10-CM | POA: Diagnosis not present

## 2023-10-21 DIAGNOSIS — O43129 Velamentous insertion of umbilical cord, unspecified trimester: Secondary | ICD-10-CM | POA: Diagnosis not present

## 2023-10-21 DIAGNOSIS — O99344 Other mental disorders complicating childbirth: Secondary | ICD-10-CM | POA: Diagnosis not present

## 2023-10-21 DIAGNOSIS — N2889 Other specified disorders of kidney and ureter: Secondary | ICD-10-CM | POA: Diagnosis not present

## 2023-10-21 DIAGNOSIS — O09523 Supervision of elderly multigravida, third trimester: Secondary | ICD-10-CM | POA: Diagnosis not present

## 2023-10-21 DIAGNOSIS — B009 Herpesviral infection, unspecified: Secondary | ICD-10-CM | POA: Diagnosis not present

## 2023-10-21 DIAGNOSIS — O99892 Other specified diseases and conditions complicating childbirth: Secondary | ICD-10-CM | POA: Diagnosis not present

## 2023-10-21 DIAGNOSIS — O403XX0 Polyhydramnios, third trimester, not applicable or unspecified: Secondary | ICD-10-CM | POA: Diagnosis not present

## 2023-10-21 DIAGNOSIS — O358XX0 Maternal care for other (suspected) fetal abnormality and damage, not applicable or unspecified: Secondary | ICD-10-CM | POA: Diagnosis not present

## 2023-10-21 DIAGNOSIS — O43123 Velamentous insertion of umbilical cord, third trimester: Secondary | ICD-10-CM | POA: Diagnosis not present

## 2023-10-21 DIAGNOSIS — O09899 Supervision of other high risk pregnancies, unspecified trimester: Secondary | ICD-10-CM | POA: Diagnosis not present

## 2023-10-21 DIAGNOSIS — O99214 Obesity complicating childbirth: Secondary | ICD-10-CM | POA: Diagnosis not present

## 2023-10-21 DIAGNOSIS — D259 Leiomyoma of uterus, unspecified: Secondary | ICD-10-CM | POA: Diagnosis not present

## 2023-10-21 DIAGNOSIS — Z23 Encounter for immunization: Secondary | ICD-10-CM | POA: Diagnosis not present

## 2023-10-21 DIAGNOSIS — Z98891 History of uterine scar from previous surgery: Secondary | ICD-10-CM | POA: Diagnosis not present

## 2023-10-21 DIAGNOSIS — Z3A39 39 weeks gestation of pregnancy: Secondary | ICD-10-CM | POA: Diagnosis not present

## 2023-10-23 DIAGNOSIS — Z98891 History of uterine scar from previous surgery: Secondary | ICD-10-CM | POA: Diagnosis not present

## 2023-12-02 ENCOUNTER — Other Ambulatory Visit: Payer: Self-pay | Admitting: Physician Assistant

## 2023-12-02 DIAGNOSIS — F410 Panic disorder [episodic paroxysmal anxiety] without agoraphobia: Secondary | ICD-10-CM

## 2023-12-02 DIAGNOSIS — F411 Generalized anxiety disorder: Secondary | ICD-10-CM

## 2023-12-02 DIAGNOSIS — R232 Flushing: Secondary | ICD-10-CM

## 2023-12-03 DIAGNOSIS — Z98891 History of uterine scar from previous surgery: Secondary | ICD-10-CM | POA: Diagnosis not present

## 2023-12-03 DIAGNOSIS — Z1331 Encounter for screening for depression: Secondary | ICD-10-CM | POA: Diagnosis not present

## 2023-12-05 ENCOUNTER — Encounter: Payer: Self-pay | Admitting: Physician Assistant

## 2023-12-20 ENCOUNTER — Ambulatory Visit: Payer: BC Managed Care – PPO | Admitting: Physician Assistant

## 2023-12-25 ENCOUNTER — Encounter: Payer: Self-pay | Admitting: Physician Assistant

## 2023-12-25 ENCOUNTER — Ambulatory Visit: Payer: BC Managed Care – PPO | Admitting: Physician Assistant

## 2023-12-25 VITALS — BP 133/76 | HR 63 | Ht 67.0 in | Wt 215.0 lb

## 2023-12-25 DIAGNOSIS — E6609 Other obesity due to excess calories: Secondary | ICD-10-CM | POA: Diagnosis not present

## 2023-12-25 DIAGNOSIS — E66811 Obesity, class 1: Secondary | ICD-10-CM

## 2023-12-25 DIAGNOSIS — Z6833 Body mass index (BMI) 33.0-33.9, adult: Secondary | ICD-10-CM | POA: Diagnosis not present

## 2023-12-25 MED ORDER — WEGOVY 0.25 MG/0.5ML ~~LOC~~ SOAJ: 0.2500 mg | SUBCUTANEOUS | 0 refills | Status: DC

## 2023-12-25 MED ORDER — WEGOVY 0.5 MG/0.5ML ~~LOC~~ SOAJ: 0.5000 mg | SUBCUTANEOUS | 0 refills | Status: DC

## 2023-12-25 MED ORDER — WEGOVY 1 MG/0.5ML ~~LOC~~ SOAJ: 1.0000 mg | SUBCUTANEOUS | 0 refills | Status: DC

## 2023-12-25 MED ORDER — WEGOVY 1.7 MG/0.75ML ~~LOC~~ SOAJ: 1.7000 mg | SUBCUTANEOUS | 0 refills | Status: DC

## 2023-12-25 NOTE — Progress Notes (Addendum)
 Established Patient Office Visit  Subjective   Patient ID: April Jarvis, female    DOB: 05-11-1981  Age: 43 y.o. MRN: [id number]  Chief Complaint  Patient presents with   Medical Management of Chronic Issues    Weight management     HPI Pt is a 43 yo obese female who presents to the follow up on weight. She is 2 months post partum. She was on wegovy  and doing well before she got pregnant. She would like to restart. She has just started back walking and hopes to start back running soon. Pt is not breastfeeding.   .. Active Ambulatory Problems    Diagnosis Date Noted   OTHER MONONEURITIS OF LOWER LIMB 10/21/2008   NUMBNESS 10/21/2008   PERONEAL NEUROPATHY 11/04/2008   Overweight (BMI 25.0-29.9) 02/28/2015   Hypertriglyceridemia 02/28/2015   Inattention 02/28/2015   Abnormal weight gain 04/01/2015   Hyperlipidemia 10/26/2016   Elevated LDL cholesterol level 12/04/2017   Obesity (BMI 30-39.9) 12/04/2017   Numbness and tingling in both hands 04/21/2021   Elevated liver enzymes 06/14/2021   Elevated TSH 06/14/2021   Class 1 obesity due to excess calories without serious comorbidity with body mass index (BMI) of 31.0 to 31.9 in adult 06/23/2021   Panic attacks 06/23/2021   Grief 11/21/2021   GAD (generalized anxiety disorder) 05/28/2022   Atypical chest pain 05/28/2022   Flushing 05/28/2022   Gallstones 12/05/2022   Left kidney mass 12/05/2022   Liver mass, right lobe 12/05/2022   History of obesity 12/11/2022   Elevated blood pressure reading 12/11/2022   Right upper quadrant pain 12/11/2022   Elevated serum creatinine 12/11/2022   Class 1 obesity due to excess calories without serious comorbidity with body mass index (BMI) of 33.0 to 33.9 in adult 12/25/2023   Resolved Ambulatory Problems    Diagnosis Date Noted   Ankle sprain 02/28/2015   Lateral epicondylitis of left elbow 12/04/2017   No Additional Past Medical History      Review of Systems  All other  systems reviewed and are negative.     Objective:     BP 133/76   Pulse 63   Ht 5' 7 (1.702 m)   Wt 215 lb (97.5 kg)   SpO2 99%   BMI 33.67 kg/m  BP Readings from Last 3 Encounters:  12/25/23 133/76  11/27/22 134/86  05/28/22 129/88   Wt Readings from Last 3 Encounters:  12/25/23 215 lb (97.5 kg)  11/27/22 155 lb (70.3 kg)  05/28/22 167 lb (75.8 kg)      Physical Exam Constitutional:      Appearance: Normal appearance. She is obese.  HENT:     Head: Normocephalic.  Cardiovascular:     Rate and Rhythm: Normal rate.     Pulses: Normal pulses.  Pulmonary:     Effort: Pulmonary effort is normal.  Musculoskeletal:     Right lower leg: No edema.     Left lower leg: No edema.  Neurological:     General: No focal deficit present.     Mental Status: She is alert and oriented to person, place, and time.  Psychiatric:        Mood and Affect: Mood normal.        The 10-year ASCVD risk score (Arnett DK, et al., 2019) is: 1.3%    Assessment & Plan:  April Jarvis was seen today for medical management of chronic issues.  Diagnoses and all orders for this visit:  Class 1 obesity  due to excess calories without serious comorbidity with body mass index (BMI) of 33.0 to 33.9 in adult -     WEGOVY  0.25 MG/0.5ML SOAJ; Inject 0.25 mg into the skin once a week. Use this dose for 1 month (4 shots) and then increase to next higher dose. -     WEGOVY  0.5 MG/0.5ML SOAJ; Inject 0.5 mg into the skin once a week. Use this dose for 1 month (4 shots) and then increase to next higher dose. -     WEGOVY  1 MG/0.5ML SOAJ; Inject 1 mg into the skin once a week. Use this dose for 1 month (4 shots) and then increase to next higher dose. -     WEGOVY  1.7 MG/0.75ML SOAJ; Inject 1.7 mg into the skin once a week. Use this dose for 1 month (4 shots) and then increase to next higher dose.   SABRA.Discussed low carb diet with 1500 calories and 80g of protein.  Exercising at least 150 minutes a week.   [employer] could be a chief technology officer.  Restart wegovy  and titrate up Pt is not breastfeeding Discussed side effects Follow up before going to 2.4mg  dose   Return in about 4 months (around 04/23/2024) for weight check.    April Mcfarland, PA-C

## 2023-12-27 ENCOUNTER — Encounter: Payer: Self-pay | Admitting: Physician Assistant

## 2023-12-27 DIAGNOSIS — K8020 Calculus of gallbladder without cholecystitis without obstruction: Secondary | ICD-10-CM

## 2023-12-30 ENCOUNTER — Encounter: Payer: Self-pay | Admitting: Physician Assistant

## 2024-01-01 ENCOUNTER — Ambulatory Visit: Payer: Self-pay | Admitting: Surgery

## 2024-01-01 NOTE — H&P (Addendum)
 Subjective    Chief Complaint: New Consultation (gallstones)       History of Present Illness: April Jarvis is a 43 y.o. female who is seen today as an office consultation at the request of Dr. Lindaann Requena for evaluation of New Consultation (gallstones) .   This is a 43 year old female in relatively good health who presents after a 45 pound weight loss over the last year.  This was intentional and was accomplished with the assistance of Wegovy .  The patient continues to take Wegovy .  She presents with a recent history of intermittent right upper quadrant abdominal pain.  She states that often this pain is exacerbated by drinking soda.  Occasionally it is exacerbated by eating meals.  She denies any other significant symptoms such as nausea, vomiting, diarrhea.  She underwent recent ultrasound that showed multiple gallstones without signs of acute cholecystitis.  She had a follow-up MRI to evaluate to possible right hepatic lesions.  MRI revealed no suspicious hepatic lesions and a benign left renal cyst.  The patient continues to have intermittent symptoms every few days.  Her surgery has been delayed a year due to pregnancy.  She presents now to discuss elective cholecystectomy.   No previous abdominal surgery   Review of Systems: A complete review of systems was obtained from the patient.  I have reviewed this information and discussed as appropriate with the patient.  See HPI as well for other ROS.   Review of Systems  Constitutional: Negative.   HENT: Negative.    Eyes: Negative.   Respiratory: Negative.    Cardiovascular: Negative.   Gastrointestinal: Negative.   Genitourinary: Negative.   Musculoskeletal: Negative.   Skin: Negative.   Neurological: Negative.   Endo/Heme/Allergies: Negative.   Psychiatric/Behavioral: Negative.          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 No pertinent surgical history.      Allergies  No Known Allergies                 Current Outpatient Medications on File Prior to Visit  Medication Sig Dispense Refill   buPROPion  (WELLBUTRIN  XL) 300 MG XL tablet Take 1 tablet by mouth once daily       busPIRone  (BUSPAR ) 5 MG tablet Take 1 tablet by mouth 3 (three) times daily       fluticasone  propionate (FLONASE ) 50 mcg/actuation nasal spray Place 2 sprays into both nostrils once daily       LORazepam  (ATIVAN ) 0.5 MG tablet Take 0.5 mg by mouth 2 (two) times daily as needed       WEGOVY  2.4 mg/0.75 mL pen injector Inject 2.4 mg subcutaneously once a week        No current facility-administered medications on file prior to visit.      Family History           Family History  Problem Relation Age of Onset   Obesity Mother     Skin cancer Father     Stroke Father     Hyperlipidemia (Elevated cholesterol) Father     Diabetes Father          Social History  Tobacco Use  Smoking Status Some Days   Types: Cigarettes  Smokeless Tobacco Never      Social History  Social History             Socioeconomic History   Marital status: Married  Tobacco Use   Smoking status: Some Days      Types: Cigarettes   Smokeless tobacco: Never  Substance and Sexual Activity   Alcohol use: Yes      Comment: 2-3   Drug use: Never        Objective:           Vitals:    01/14/23 1104  BP: 133/85  Pulse: 105  Temp: 36.9 C (98.4 F)  SpO2: 96%  Weight: 69.7 kg (153 lb 9.6 oz)  Height: 167.6 cm (5\' 6" )    Body mass index is 24.79 kg/m.   Physical Exam    Constitutional:  WDWN in NAD, conversant, no obvious deformities; lying in bed comfortably Eyes:  Pupils equal, round; sclera anicteric; moist conjunctiva; no lid lag HENT:  Oral mucosa moist; good dentition  Neck:  No  masses palpated, trachea midline; no thyromegaly Lungs:  CTA bilaterally; normal respiratory effort CV:  Regular rate and rhythm; no murmurs; extremities well-perfused with no edema Abd:  +bowel sounds, soft, non-tender, no palpable organomegaly; no palpable hernias Musc:  normal gait; no apparent clubbing or cyanosis in extremities Lymphatic:  No palpable cervical or axillary lymphadenopathy Skin:  Warm, dry; no sign of jaundice Psychiatric - alert and oriented x 4; calm mood and affect     Labs, Imaging and Diagnostic Testing:   LFTs showed a mildly elevated ALT but normal bilirubin. CLINICAL DATA:  Right upper abdominal pain for 2 months.   EXAM: ABDOMEN ULTRASOUND COMPLETE   COMPARISON:  None Available.   FINDINGS: Gallbladder: Gallstones measure up to 8 mm in size. No gallbladder wall thickening visualized. No sonographic Murphy sign noted by sonographer.   Common bile duct: Diameter: 5 mm   Liver: 2 nearby hyperechoic lesions in the right hepatic lobe measure 1.4 x 0.9 x 1.5 cm and 1.0 x 0.9 x 1.1 cm. Within normal limits in parenchymal echogenicity. Portal vein is patent on color Doppler imaging with normal direction of blood flow towards the liver.   IVC: No abnormality visualized.   Pancreas: Visualized portion unremarkable.   Spleen: Small in size.   Right Kidney: Length: 10.4 cm. Echogenicity within normal limits. No mass or hydronephrosis visualized.   Left Kidney: Length: 9.6 cm. Echogenicity within normal limits. A cyst in the lower pole of the left kidney measures 1.8 x 1.3 x 2.0 cm. An adjacent shadowing calcification measures 0.9 cm. A hypoechoic mass in the kidney measures 1.5 x 1.2 x 1.3 cm. No hydronephrosis visualized.   Abdominal aorta: No aneurysm visualized.   Other findings: None.   IMPRESSION: 1. Cholelithiasis without evidence of acute cholecystitis. 2. Two hyperechoic lesions in the right hepatic lobe. If the patient has no history of  cancer or liver disease, then this is likely a hemangioma and benign. Follow-up right upper quadrant ultrasound could be considered in 6-12 months. If the patient does have a history of cancer or liver disease, MR abdomen would be recommended. 3. Indeterminate hypoechoic mass in the lower pole of the left kidney may represent a complicated cyst versus solid mass. Recommend MR abdomen (according to a renal or liver protocol) for further evaluation. At that time, recommend attention to the liver lesions for further  characterization.     Electronically Signed   By: Tita Form M.D.   On: 12/04/2022 15:00   CLINICAL DATA:  Further evaluation of hepatic and renal lesions.   EXAM: MRI ABDOMEN WITHOUT AND WITH CONTRAST   TECHNIQUE: Multiplanar multisequence MR imaging of the abdomen was performed both before and after the administration of intravenous contrast.   CONTRAST:  7mL GADAVIST  GADOBUTROL  1 MMOL/ML IV SOLN   COMPARISON:  Ultrasound December 04, 2022.   FINDINGS: Lower chest: No acute abnormality.   Hepatobiliary: No suspicious hepatic lesion specifically no suspicious lesion in the right lobe of the liver to correspond with the findings on prior ultrasound. Cholelithiasis without findings of acute cholecystitis. No biliary ductal dilation.   Pancreas: No pancreatic ductal dilation or evidence of acute inflammation.   Spleen:  No splenomegaly.   Adrenals/Urinary Tract:  Bilateral adrenal glands appear normal.   No hydronephrosis.   Lesion in the left interpolar kidney is homogeneously T1 hyperintense measuring 15 mm on image 13/10 without suspicious postcontrast enhancement on subtraction imaging. Compatible with a benign hemorrhagic/proteinaceous renal cyst requiring no independent imaging follow-up.   Additional fluid signal left renal lesions compatible with cysts measure up to 9 mm compatible with cysts and considered benign requiring no independent imaging  follow-up.   Stomach/Bowel: Visualized portions within the abdomen are unremarkable.   Vascular/Lymphatic: No pathologically enlarged lymph nodes identified. No abdominal aortic aneurysm demonstrated.   Other: Heterogeneous stranding in the left anterior abdominal wall for instance on image 22/20 may reflect sequela of subcutaneous injection.   Musculoskeletal: No suspicious bone lesions identified.   IMPRESSION: 1. No suspicious hepatic lesion specifically no suspicious lesion in the right lobe of the liver to correspond with the findings on prior ultrasound. 2. Benign hemorrhagic/proteinaceous left renal cyst requiring no independent imaging follow-up. 3. Cholelithiasis without findings of acute cholecystitis. 4. Heterogeneous stranding in the left anterior abdominal wall may reflect sequela of subcutaneous injection.     Electronically Signed   By: Tama Fails M.D.   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  Potential risks, benefits, alternative treatments, and expected outcomes have been explained.  All of the patient's questions at this time have been answered.  The likelihood of reaching the patient's treatment goal is good.  The patient understand the proposed surgical procedure and wishes to proceed.  Kari Otto. Eli Grizzle, MD, [facility]  General Surgery   01/01/2024 11:15 AM

## 2024-01-09 DIAGNOSIS — Z8759 Personal history of other complications of pregnancy, childbirth and the puerperium: Secondary | ICD-10-CM | POA: Diagnosis not present

## 2024-01-09 DIAGNOSIS — N920 Excessive and frequent menstruation with regular cycle: Secondary | ICD-10-CM | POA: Diagnosis not present

## 2024-01-09 DIAGNOSIS — Z1331 Encounter for screening for depression: Secondary | ICD-10-CM | POA: Diagnosis not present

## 2024-01-15 ENCOUNTER — Encounter: Payer: Self-pay | Admitting: Physician Assistant

## 2024-01-15 NOTE — Telephone Encounter (Signed)
See other MyChart message

## 2024-01-17 ENCOUNTER — Other Ambulatory Visit: Payer: Self-pay | Admitting: Physician Assistant

## 2024-01-17 DIAGNOSIS — R41840 Attention and concentration deficit: Secondary | ICD-10-CM

## 2024-01-22 ENCOUNTER — Telehealth: Payer: Self-pay | Admitting: Physician Assistant

## 2024-01-22 NOTE — Telephone Encounter (Signed)
 Patient calling to check the status of the prior authorization needed to for Wegovy  Rx. Patient has sent multiple MyChart messages regarding the PA. Please update the patient on the status of the PA.  Copied from CRM [id number]. Topic: Clinical - Prescription Issue >> Jan 22, 2024  3:00 PM [name] wrote: Reason for CRM: April Jarvis sent this message through MyChart:  For my prescription Wegovy , It says to ask the doctor to visit [email or web address] or call [facility] at [phone] to get approval.  Thank you She wants to check to see if prior authorization has been completed. Please call her at [phone] or message through MyChart

## 2024-01-27 ENCOUNTER — Telehealth: Payer: Self-pay

## 2024-01-27 NOTE — Telephone Encounter (Signed)
 Pt called office to check on Wegovy  Prior Auth status. Pt is frustrated that she has not received a response. Please advise.   CB:[phone]

## 2024-01-27 NOTE — Telephone Encounter (Signed)
 Prior auth for: WEGOVY  Determination: Pending as of 01/27/24 Auth #: n/a Valid from: n/a Patient notified via MyChart   Clinical notes regarding last visit and therapeutic failures were required for medical review.

## 2024-01-27 NOTE — Telephone Encounter (Signed)
 Prior auth for: [facility] Determination: Pending as of 01/27/24 Auth #: n/a Valid from: n/a Patient notified via MyChart

## 2024-01-28 ENCOUNTER — Telehealth: Payer: Self-pay

## 2024-01-28 NOTE — Telephone Encounter (Signed)
Copied from CRM [id number]. Topic: Clinical - Prescription Issue >> Jan 27, 2024  2:03 PM Gery Pray wrote: Reason for CRM: Patient calling to check the status of the prior authorization needed to for [facility] Rx. Patient has sent multiple MyChart messages regarding the PA. Please update the patient on the status of the PA.  Copied from CRM [id number]. Topic: Clinical - Prescription Issue >> Jan 22, 2024  3:00 PM Clayton Bibles wrote: Reason for CRM: Marcelino Duster sent this message through MyChart:  For my prescription Wegovy, It says to ask the doctor to visit [email or web address] or call [facility] at [phone] to get approval.  Thank you She wants to check to see if prior authorization has been completed. Please call her at [phone] or message through MyChart

## 2024-01-28 NOTE — Telephone Encounter (Signed)
Prior auth for: [facility] Determination: Pending as of 01/27/24 Auth #: n/a Valid from: n/a Patient notified via MyChart

## 2024-01-29 ENCOUNTER — Telehealth: Payer: BC Managed Care – PPO | Admitting: Physician Assistant

## 2024-02-04 ENCOUNTER — Other Ambulatory Visit: Payer: Self-pay | Admitting: Surgery

## 2024-02-04 DIAGNOSIS — K8010 Calculus of gallbladder with chronic cholecystitis without obstruction: Secondary | ICD-10-CM | POA: Diagnosis not present

## 2024-02-04 DIAGNOSIS — K811 Chronic cholecystitis: Secondary | ICD-10-CM | POA: Diagnosis not present

## 2024-02-05 LAB — SURGICAL PATHOLOGY

## 2024-02-16 ENCOUNTER — Encounter: Payer: Self-pay | Admitting: Physician Assistant

## 2024-02-26 ENCOUNTER — Telehealth: Admitting: Physician Assistant

## 2024-02-26 ENCOUNTER — Ambulatory Visit: Payer: BC Managed Care – PPO

## 2024-02-26 DIAGNOSIS — E6609 Other obesity due to excess calories: Secondary | ICD-10-CM | POA: Diagnosis not present

## 2024-02-26 DIAGNOSIS — E66811 Obesity, class 1: Secondary | ICD-10-CM

## 2024-02-26 DIAGNOSIS — Z1231 Encounter for screening mammogram for malignant neoplasm of breast: Secondary | ICD-10-CM

## 2024-02-26 MED ORDER — WEGOVY 0.25 MG/0.5ML ~~LOC~~ SOAJ: 0.2500 mg | SUBCUTANEOUS | 0 refills | Status: DC

## 2024-02-26 NOTE — Telephone Encounter (Signed)
 Prior auth for: [facility] Determination: DENIED Auth #: n/a Reason: The medication is not eligible for coverage under the member's medical benefit. This member does not have retail pharmacy coverage with [employer] but may coverage through another vendor or a separate drug prescription drug coverage.   Patient notified by the provider

## 2024-02-26 NOTE — Progress Notes (Unsigned)
..  Virtual Visit via Video Note  I connected with April Jarvis on 02/26/24 at  8:50 AM EDT by a video enabled telemedicine application and verified that I am speaking with the correct person using two identifiers.  Location: Patient: home   Provider: clinic  .Marland KitchenParticipating in visit:  Patient: April Jarvis Provider: Tandy Gaw PA-C   I discussed the limitations of evaluation and management by telemedicine and the availability of in person appointments. The patient expressed understanding and agreed to proceed.  History of Present Illness: Pt has not heard about PA on Wegovy. She has sent multiple msgs and her insurance company has sent her scales and her weight loss plan but no medication.     Observations/Objective: No acute distress Normal mood and appearance   Assessment and Plan: Marland KitchenMarland KitchenDiagnoses and all orders for this visit:  Class 1 obesity due to excess calories without serious comorbidity with body mass index (BMI) of 33.0 to 33.9 in adult -     WEGOVY 0.25 MG/0.5ML SOAJ; Inject 0.25 mg into the skin once a week. Use this dose for 1 month (4 shots) and then increase to next higher dose.   2/13 ins denied coverage due to not having retail pharmacy coverage.  [facility] to [facility] because she does have coverage through [facility].  Discussed compounding if she cannot get this approved.    Follow Up Instructions:    I discussed the assessment and treatment plan with the patient. The patient was provided an opportunity to ask questions and all were answered. The patient agreed with the plan and demonstrated an understanding of the instructions.   The patient was advised to call back or seek an in-person evaluation if the symptoms worsen or if the condition fails to improve as anticipated.     Tandy Gaw, PA-C

## 2024-02-27 ENCOUNTER — Encounter: Payer: Self-pay | Admitting: Physician Assistant

## 2024-03-01 ENCOUNTER — Encounter: Payer: Self-pay | Admitting: Physician Assistant

## 2024-03-01 NOTE — Progress Notes (Signed)
 Normal mammogram. Follow up in 1 year.

## 2024-03-03 ENCOUNTER — Telehealth: Payer: Self-pay

## 2024-03-03 ENCOUNTER — Encounter: Payer: Self-pay | Admitting: Physician Assistant

## 2024-03-03 NOTE — Telephone Encounter (Signed)
 Copied from CRM [id number]. Topic: Clinical - Prescription Issue >> Mar 03, 2024  2:31 PM Nila Nephew wrote: Reason for CRM: Patient is calling to state that insurance has denied the PA. Media tab is showing a questionnaire regarding more information needed for the PA. Patient states the submission did not include a BMI per insurance and therefore it was denied. Patient requesting next steps and an update regarding how to get this passed, if at all possible.

## 2024-03-03 NOTE — Telephone Encounter (Signed)
 Tried calling the patient regarding the resubmission of the prior auth for [facility]. Patient informed to disregard message from insurance that current case is denied. This is an automatic response when forms are not submitted within 2 hours. Required form placed in provider's box for completion. Case # [id number]

## 2024-03-09 ENCOUNTER — Telehealth: Payer: Self-pay | Admitting: Physician Assistant

## 2024-03-09 NOTE — Telephone Encounter (Signed)
 Copied from CRM [id number]. Topic: Clinical - Prescription Issue >> Mar 09, 2024  2:34 PM Antwanette L wrote: Reason for CRM: Radhika from [facility] is calling because she needs clarification on a prescription refill that [facility] ordered. The prescription is for WEGOVY 0.25 MG/0.5ML SOAJ and the pharmacist is confused on the directions and wants to know if they should prescribe a 28 day supply?  Please call pharmacy at [phone] and mention reference number [id number]

## 2024-03-10 ENCOUNTER — Telehealth: Payer: Self-pay

## 2024-03-10 NOTE — Telephone Encounter (Signed)
 [facility] pharmacist with [facility]  [phone] Refer to invoice # [id number] when calling   Wegovy rx sent on 02/26/24  States wegovy 0.25mg   is the only amount in this wegovy pen.  Rob Bunting is for 6ml but patient is to titrate up after one month on 0.25 So would need a new script for higher strength next month Can wegovy script be resent with qty 2ml  for 28 day supply ?

## 2024-03-11 ENCOUNTER — Telehealth: Payer: Self-pay | Admitting: Pharmacy Technician

## 2024-03-11 ENCOUNTER — Encounter: Payer: Self-pay | Admitting: Physician Assistant

## 2024-03-11 ENCOUNTER — Telehealth: Payer: Self-pay

## 2024-03-11 ENCOUNTER — Other Ambulatory Visit (HOSPITAL_COMMUNITY): Payer: Self-pay

## 2024-03-11 NOTE — Telephone Encounter (Signed)
 Pharmacy Patient Advocate Encounter   Received notification from Onbase that prior authorization for [facility] 0.25MG /0.5ML AUTO-INJECTORS is required/requested.   Insurance verification completed.   The patient is insured through [employer] .   Per test claim: PA required; PA submitted to above mentioned insurance via Fax Key/confirmation #/EOC [id number] Status is pending  Faxed the PA form that was already started from a previous case. Form was in Onbase to complete and fax to [facility].

## 2024-03-11 NOTE — Telephone Encounter (Signed)
 Pharmacy Patient Advocate Encounter   Received notification from Onbase that prior authorization for Wegovy 0.25 mg/ml auto injector is required/requested.   Insurance verification completed.   The patient is insured through [facility] .   Per test claim: PA required; PA submitted to above mentioned insurance via Fax Key/confirmation #/EOC -- Status is pending  *faxed to insurance at [phone]

## 2024-03-13 ENCOUNTER — Other Ambulatory Visit (HOSPITAL_COMMUNITY): Payer: Self-pay

## 2024-03-13 NOTE — Telephone Encounter (Signed)
 PA forms have been faxed to insurance as of 03/11/2024 per chart notes

## 2024-03-13 NOTE — Telephone Encounter (Signed)
 Would you like me to create these prescriptions in patient chart? I do not see where they are in medication list.

## 2024-03-16 ENCOUNTER — Encounter: Payer: Self-pay | Admitting: Physician Assistant

## 2024-03-16 ENCOUNTER — Other Ambulatory Visit (HOSPITAL_COMMUNITY): Payer: Self-pay

## 2024-03-16 ENCOUNTER — Telehealth: Admitting: Physician Assistant

## 2024-03-16 DIAGNOSIS — E6609 Other obesity due to excess calories: Secondary | ICD-10-CM

## 2024-03-16 MED ORDER — WEGOVY 0.25 MG/0.5ML ~~LOC~~ SOAJ: 0.2500 mg | SUBCUTANEOUS | 0 refills | Status: DC

## 2024-03-16 NOTE — Telephone Encounter (Signed)
 Pharmacy Patient Advocate Encounter  Received notification from [facility] that Prior Authorization for [name] 0.25MG /0.5ML AUTO-INJECTORS  has been APPROVED from 03/14/2024 to 10/10/2024. Ran test claim, Copay is $35.00. This test claim was processed through [facility]- copay amounts may vary at other pharmacies due to pharmacy/plan contracts, or as the patient moves through the different stages of their insurance plan.   PA #/Case ID/Reference #: [id number]

## 2024-03-23 ENCOUNTER — Other Ambulatory Visit (HOSPITAL_COMMUNITY): Payer: Self-pay

## 2024-03-23 NOTE — Telephone Encounter (Signed)
 Pharmacy Patient Advocate Encounter  Received notification from [facility] that Prior Authorization for Wegovy 0.25 mg/ml auto injector  has been APPROVED from 03/16/24 to 09/15/25. Unable to obtain price due to refill too soon rejection, last fill date 03/16/24 next available fill date04/20/25   PA #/Case ID/Reference #: --

## 2024-03-24 ENCOUNTER — Encounter: Payer: Self-pay | Admitting: Physician Assistant

## 2024-03-25 ENCOUNTER — Other Ambulatory Visit: Payer: Self-pay | Admitting: Physician Assistant

## 2024-03-25 MED ORDER — WEGOVY 1.7 MG/0.75ML ~~LOC~~ SOAJ: 1.7000 mg | SUBCUTANEOUS | 0 refills | Status: AC

## 2024-03-25 MED ORDER — WEGOVY 1 MG/0.5ML ~~LOC~~ SOAJ: 1.0000 mg | SUBCUTANEOUS | 0 refills | Status: AC

## 2024-03-25 MED ORDER — WEGOVY 0.5 MG/0.5ML ~~LOC~~ SOAJ: 0.5000 mg | SUBCUTANEOUS | 0 refills | Status: DC

## 2024-03-25 MED ORDER — WEGOVY 2.4 MG/0.75ML ~~LOC~~ SOAJ: 2.4000 mg | SUBCUTANEOUS | 0 refills | Status: AC

## 2024-04-06 ENCOUNTER — Other Ambulatory Visit (HOSPITAL_COMMUNITY): Payer: Self-pay

## 2024-04-06 ENCOUNTER — Telehealth: Payer: Self-pay

## 2024-04-06 NOTE — Telephone Encounter (Signed)
 Can the RX auth team assist with this PA request. Thanks in advance.

## 2024-04-06 NOTE — Telephone Encounter (Signed)
 Pharmacy Patient Advocate Encounter  Received notification from [facility] that Prior Authorization for Wegovy  0.25 has been approved. Unable to test bill due to a refill to soon rejection, next date of fill 04/07/24   PA #/Case ID/Reference #: [id number]

## 2024-04-07 ENCOUNTER — Other Ambulatory Visit (HOSPITAL_COMMUNITY): Payer: Self-pay

## 2024-04-07 NOTE — Telephone Encounter (Signed)
 Pharmacy Patient Advocate Encounter  Received notification from [facility] that Prior Authorization for Wegovy  0.25 has been APPROVED from 04/06/24 to 10/10/24. Ran test claim, Copay is $24.99. This test claim was processed through [facility]- copay amounts may vary at other pharmacies due to pharmacy/plan contracts, or as the patient moves through the different stages of their insurance plan.   PA #/Case ID/Reference #: [id number]

## 2024-04-10 NOTE — Telephone Encounter (Signed)
 This request has been handled. No further action is required.

## 2024-04-10 NOTE — Telephone Encounter (Signed)
 Pharmacy Patient Advocate Encounter  Received notification from [facility] that Prior Authorization for Wegovy 0.25 mg/ml auto injector  has been APPROVED from 03/16/24 to 09/15/25. Unable to obtain price due to refill too soon rejection, last fill date 03/16/24 next available fill date04/20/25   PA #/Case ID/Reference #: --

## 2024-04-22 ENCOUNTER — Encounter: Payer: Self-pay | Admitting: Physician Assistant

## 2024-04-22 ENCOUNTER — Other Ambulatory Visit: Payer: Self-pay | Admitting: Physician Assistant

## 2024-04-23 MED ORDER — WEGOVY 2.4 MG/0.75ML ~~LOC~~ SOAJ: 2.4000 mg | SUBCUTANEOUS | 3 refills | Status: DC

## 2024-05-06 ENCOUNTER — Ambulatory Visit: Admitting: Physician Assistant

## 2024-05-06 VITALS — BP 111/77 | HR 68 | Ht 67.0 in | Wt 218.4 lb

## 2024-05-06 DIAGNOSIS — E66811 Obesity, class 1: Secondary | ICD-10-CM

## 2024-05-06 DIAGNOSIS — M25872 Other specified joint disorders, left ankle and foot: Secondary | ICD-10-CM | POA: Diagnosis not present

## 2024-05-06 DIAGNOSIS — Z6834 Body mass index (BMI) 34.0-34.9, adult: Secondary | ICD-10-CM | POA: Diagnosis not present

## 2024-05-06 DIAGNOSIS — E6609 Other obesity due to excess calories: Secondary | ICD-10-CM | POA: Diagnosis not present

## 2024-05-06 MED ORDER — MELOXICAM 15 MG PO TABS: 15.0000 mg | ORAL_TABLET | Freq: Every day | ORAL | 1 refills | Status: DC

## 2024-05-06 MED ORDER — PREDNISONE 50 MG PO TABS: ORAL_TABLET | ORAL | 0 refills | Status: DC

## 2024-05-06 NOTE — Progress Notes (Signed)
   Acute Office Visit  Subjective:     Patient ID: Alverta Johns, female    DOB: May 08, 1981, 43 y.o.   MRN: [id number]  Chief Complaint  Patient presents with   Toe Pain    HPI Patient is in today for pain under left great toe for 6 months months. She often feels the dull ache and some times worse than other. There is a lot of pain with running and walking without shoes. Less discomfort with wearing sneakers. At rest no pain. Worse when trying to stand on tippy toes. Not taken anything for it. Not iced it. Thought it would go away. She is 6 months post partnum and gained a lot of weight but working on losing it and started wegovy . She is down 6lbs from highest weight.   ROS See HPI.      Objective:    BP 111/77 (BP Location: Left Arm, Patient Position: Sitting, Cuff Size: Large)   Pulse 68   Ht 5\' 7"  (1.702 m)   Wt 218 lb 6.4 oz (99.1 kg)   SpO2 99%   BMI 34.21 kg/m  BP Readings from Last 3 Encounters:  05/06/24 111/77  12/25/23 133/76  11/27/22 134/86   Wt Readings from Last 3 Encounters:  05/06/24 218 lb 6.4 oz (99.1 kg)  12/25/23 215 lb (97.5 kg)  11/27/22 155 lb (70.3 kg)      Physical Exam Musculoskeletal:     Comments: Left foot:  No swelling or erythema 2+pedal pulses NROM of foot Pain to palpation plantar side of great left toe at sesamoid bones No pain over bunion          Assessment & Plan:  Aaron AasAaron AasBerniece was seen today for toe pain.  Diagnoses and all orders for this visit:  Sesamoiditis of left foot -     predniSONE  (DELTASONE ) 50 MG tablet; Take one tablet for 5 days. -     meloxicam (MOBIC) 15 MG tablet; Take 1 tablet (15 mg total) by mouth daily. To start after prednisone . -     DG Toe Great Left; Future  Class 1 obesity due to excess calories without serious comorbidity with body mass index (BMI) of 34.0 to 34.9 in adult   Discussed sesamoiditis Xrays ordered today Offload as much as possible with good supportive shoe Ice foot  regularly at least once a day Prednisone  burst for 5 days Then mobic daily for 2 weeks Avoid walking and running for exercise Consider biking and swimming Exercise rehab program given today If no improvement in next 2 weeks consider post op shoe and referral Continue to work on weight loss with wegovy  and diet changes  Sandy Crumb, PA-C

## 2024-05-06 NOTE — Patient Instructions (Signed)
Sesamoid Injury  A sesamoid injury happens when a sesamoid bone or a surrounding tendon gets damaged during activity. A sesamoid bone is a bone that is connected to a tendon or a muscle, but not to a joint. There are sesamoid bones in your hands, knees, and feet. Your kneecap is an example of a sesamoid bone. Sesamoid injuries may include irritation, dislocation, or a break (fracture) in a sesamoid bone. The most common sesamoid injuries affect the sesamoid bones under the big toe. These bones help you move forward during weight-bearing activities. What are the causes? This condition is caused by damage to a sesamoid bone or a surrounding tendon. What increases the risk? This condition is more likely to develop in people who: Dance and Archivist. Run. Play sports. Are active on artificial turf. Wear high heels. Have an injury to the thumb. What are the signs or symptoms? Symptoms of this condition include: Pain in the affected hand, knee, or foot. Pain when you try to straighten (extend) or bend (flex) the affected toe, knee, or finger. A popping sound that happens at the time of injury. Swelling. Bruising. How is this diagnosed? This condition is diagnosed with: A physical exam. Observation of your movement while you walk. An X-ray. Bone scans. How is this treated? Treatment for this condition depends on the location, type, and severity of the injury. Treatment may include: Resting the affected area and avoiding activities that are causing injury. Applying ice to the affected area. Taking over-the-counter pain medicine. Placing a cushioned pad in the shoe of the affected foot. Taping the affected finger or toe to prevent movement. Getting steroid injections. Wearing a cast, brace, or orthotic shoe. Doing physical therapy. Surgery may be needed if other treatments do not work. Follow these instructions at home: If you have a cast: Do not put pressure on any part of the  cast until it is fully hardened. This may take several hours. Do not stick anything inside the cast to scratch your skin. Doing that increases your risk of infection. Check the skin around it every day. Tell your health care provider about any concerns. You may put lotion on dry skin around the edges of the cast. Do not put lotion on the skin underneath it. Keep it clean and dry. If you have a brace or orthotic shoe: Wear it as told by your health care provider. Remove it only as told by your health care provider. Loosen it if your fingers or toes tingle, become numb, or turn cold and blue. Keep it clean and dry. Bathing Do not take baths, swim, or use a hot tub until your health care provider approves. Ask your health care provider if you may take showers. You may only be allowed to take sponge baths. If the cast, brace, or orthotic shoe is not waterproof: Do not let it get wet. Cover it with a watertight covering when you take a bath or shower. Managing pain, stiffness, and swelling  If directed, put ice on the injured area. To do this: If you have a removable brace or orthotic shoe, remove it as told by your health care provider. Put ice in a plastic bag. Place a towel between your skin and the bag or between your cast and the bag. Leave the ice on for 20 minutes, 2-3 times a day. Remove the ice if your skin turns bright red. This is very important. If you cannot feel pain, heat, or cold, you have a greater risk of  damage to the area. Move your fingers or toes often to reduce stiffness and swelling. Raise (elevate) the injured area above the level of your heart while you are sitting or lying down. Driving Ask your health care provider if the medicine prescribed to you requires you to avoid driving or using heavy machinery. Ask your health care provider when it is safe to drive if you have a cast, brace, or orthotic shoe on a foot that you use for driving. Activity Do not use the  injured limb to support your body weight until your health care provider says that you can. Use crutches as told by your health care provider. Do exercises as told by your health care provider or physical therapist. Return to your normal activities as told by your health care provider. Ask your health care provider what activities are safe for you. General instructions Take over-the-counter and prescription medicines only as told by your health care provider. Do not use any products that contain nicotine or tobacco before the procedure. These products include cigarettes, chewing tobacco, and vaping devices, such as e-cigarettes. If you need help quitting, ask your health care provider. Keep all follow-up visits. This is important. Contact a health care provider if: You have pain and swelling that continues, even with treatment. Your pain and swelling returns after you get back to your normal activities. You cannot put pressure on your foot. Get help right away if: You lose sensation in the affected area. Your fingers or toes turn cold and blue. Summary A sesamoid injury happens when a sesamoid bone or a surrounding tendon gets damaged during activity. Symptoms of this condition include pain in the affected area, a popping sound at the time of injury, swelling, and bruising. Treatment for this condition depends on the location, type, and severity of the injury. This information is not intended to replace advice given to you by your health care provider. Make sure you discuss any questions you have with your health care provider. Document Revised: 08/07/2021 Document Reviewed: 08/07/2021 Elsevier Patient Education  2024 [employer].

## 2024-05-28 ENCOUNTER — Other Ambulatory Visit: Payer: Self-pay | Admitting: Physician Assistant

## 2024-05-28 DIAGNOSIS — E663 Overweight: Secondary | ICD-10-CM

## 2024-06-23 ENCOUNTER — Encounter: Payer: Self-pay | Admitting: Physician Assistant

## 2024-06-24 ENCOUNTER — Ambulatory Visit

## 2024-06-24 DIAGNOSIS — M79675 Pain in left toe(s): Secondary | ICD-10-CM | POA: Diagnosis not present

## 2024-06-24 DIAGNOSIS — M25872 Other specified joint disorders, left ankle and foot: Secondary | ICD-10-CM

## 2024-06-24 DIAGNOSIS — M19072 Primary osteoarthritis, left ankle and foot: Secondary | ICD-10-CM | POA: Diagnosis not present

## 2024-06-26 ENCOUNTER — Ambulatory Visit: Payer: Self-pay | Admitting: Physician Assistant

## 2024-06-26 ENCOUNTER — Encounter: Payer: Self-pay | Admitting: Physician Assistant

## 2024-06-26 DIAGNOSIS — M19079 Primary osteoarthritis, unspecified ankle and foot: Secondary | ICD-10-CM | POA: Insufficient documentation

## 2024-06-26 NOTE — Progress Notes (Signed)
 Arthritis! Make appt for Dr. ONEIDA to consider injection in the joint!

## 2024-07-06 ENCOUNTER — Ambulatory Visit: Admitting: Sports Medicine

## 2024-07-06 DIAGNOSIS — M19079 Primary osteoarthritis, unspecified ankle and foot: Secondary | ICD-10-CM | POA: Diagnosis not present

## 2024-07-06 MED ORDER — MELOXICAM 15 MG PO TABS: ORAL_TABLET | ORAL | 3 refills | Status: DC

## 2024-07-06 NOTE — Assessment & Plan Note (Addendum)
 Very pleasant 43 year old female, several weeks of pain really left first MTP. Does a lot of barefoot walking. She has had some prednisone  and Mobic , Mobic  seem to help to some degree. X-rays confirmed osteoarthritis. We discussed the anatomy and pathophysiology, we set realistic expectations today, she will do a Morton's plate with first metatarsal extension for about 4 weeks. We will then here set for custom molded orthotics with first metatarsal ray posting. She will avoid barefoot walking and consider getting some supportive shoes to wear inside the house. Home physical therapy with intrinsic muscle conditioning given. Return to see me in 6 to 8 weeks. Injection if not better.

## 2024-07-06 NOTE — Progress Notes (Signed)
    Procedures performed today:    None.  Independent interpretation of notes and tests performed by another provider:   None.  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 Does a lot of barefoot walking. She has had some prednisone  and Mobic , Mobic  seem to help to some degree. X-rays confirmed osteoarthritis. We discussed the anatomy and pathophysiology, we set realistic expectations today, she will do a Morton's plate with first metatarsal extension for about 4 weeks. We will then here set for custom molded orthotics with first metatarsal ray posting. She will avoid barefoot walking and consider getting some supportive shoes to wear inside the house. Home physical therapy with intrinsic muscle conditioning given. Return to see me in 6 to 8 weeks. Injection if not better.    ____________________________________________ Debby PARAS. Curtis, M.D., ABFM., CAQSM., AME. Primary Care and Sports Medicine [facility]  Adjunct Professor of [facility] Medicine  [employer]  Restaurant manager, fast food

## 2024-07-06 NOTE — Patient Instructions (Signed)
 April Jarvis

## 2024-07-10 DIAGNOSIS — D225 Melanocytic nevi of trunk: Secondary | ICD-10-CM | POA: Diagnosis not present

## 2024-07-10 DIAGNOSIS — L578 Other skin changes due to chronic exposure to nonionizing radiation: Secondary | ICD-10-CM | POA: Diagnosis not present

## 2024-07-10 DIAGNOSIS — L814 Other melanin hyperpigmentation: Secondary | ICD-10-CM | POA: Diagnosis not present

## 2024-07-10 DIAGNOSIS — L570 Actinic keratosis: Secondary | ICD-10-CM | POA: Diagnosis not present

## 2024-07-10 DIAGNOSIS — B079 Viral wart, unspecified: Secondary | ICD-10-CM | POA: Diagnosis not present

## 2024-07-14 ENCOUNTER — Encounter: Payer: Self-pay | Admitting: Family Medicine

## 2024-07-14 ENCOUNTER — Ambulatory Visit (INDEPENDENT_AMBULATORY_CARE_PROVIDER_SITE_OTHER): Admitting: Family Medicine

## 2024-07-14 VITALS — BP 114/80 | Ht 67.0 in | Wt 200.0 lb

## 2024-07-14 DIAGNOSIS — M19079 Primary osteoarthritis, unspecified ankle and foot: Secondary | ICD-10-CM | POA: Diagnosis not present

## 2024-07-14 NOTE — Patient Instructions (Signed)
 Thank you for coming to see me today. It was a pleasure.   We made you new orthotics. Let us know if we need to add any extra cushioning or make changes.  If you have any questions or concerns, please do not hesitate to call the office at [phone].

## 2024-07-14 NOTE — Assessment & Plan Note (Signed)
 Chronic left foot pain with first MTP osteoarthritis and hallux rigidus  Plan: - Previous notes with Dr. Curtis reviewed - Patient candidate for custom orthotic.  This was fabricated today as noted below.  Patient was fitted for a : Fit 'n Run semi-rigid orthotic  The orthotic was heated, placed on the orthotic stand. The patient was positioned in subtalar neutral position and 10 degrees of ankle dorsiflexion in a weight bearing stance on the heated orthotic blank After completion of molding Blank: Fit 'n Run - size 8 Posting:  LT 1st ray post with blue poron Base: none - built-in base with Fit and Run  Orthotic was comfortable in the office and she had neutral alignment. Should follow-up with Dr. Curtis as scheduled

## 2024-07-14 NOTE — Progress Notes (Signed)
 DATE OF VISIT: 07/14/2024        April Jarvis DOB: [birth year] MRN: [id number]  CC:  Custom orthotics  History of present Illness: April Jarvis is a 43 y.o. female who presents for custom orthotics Referred by Dr Curtis Has left foot pain with 1st MTP OA Has been using Morton's plate which has been helpful  Medications:  Outpatient Encounter Medications as of 07/14/2024  Medication Sig   buPROPion  (WELLBUTRIN  XL) 300 MG 24 hr tablet TAKE 1 TABLET DAILY   busPIRone  (BUSPAR ) 5 MG tablet TAKE 1 TABLET THREE TIMES A DAY   fluticasone  (FLONASE ) 50 MCG/ACT nasal spray SPRAY 2 SPRAYS INTO EACH NOSTRIL EVERY DAY (Patient taking differently: Place 1 spray into both nostrils daily as needed for allergies.)   LORazepam  (ATIVAN ) 0.5 MG tablet Take 1 tablet (0.5 mg total) by mouth 2 (two) times daily as needed for anxiety.   meloxicam  (MOBIC ) 15 MG tablet One tab PO every 24 hours with a meal for 2 weeks, then once every 24 hours prn pain.   Multiple Vitamins-Minerals (MULTIVITAMIN WITH MINERALS) tablet Take 1 tablet by mouth daily.   WEGOVY  1 MG/0.5ML SOAJ Inject 1 mg into the skin once a week. Use this dose for 1 month (4 shots) and then increase to next higher dose.   WEGOVY  1.7 MG/0.75ML SOAJ Inject 1.7 mg into the skin once a week. Use this dose for 1 month (4 shots) and then increase to next higher dose.   WEGOVY  2.4 MG/0.75ML SOAJ Inject 2.4 mg into the skin once a week.   WEGOVY  2.4 MG/0.75ML SOAJ Inject 2.4 mg into the skin once a week.   No facility-administered encounter medications on file as of 07/14/2024.    Allergies: has no known allergies.  Physical Examination: Vitals: BP 114/80   Ht 5' 7 (1.702 m)   Wt 200 lb (90.7 kg)   BMI 31.32 kg/m  GENERAL:  April Jarvis is a 43 y.o. female appearing their stated age, alert and oriented x 3, in no apparent distress.  SKIN: no rashes or lesions, skin clean, dry, intact MSK: Foot/ankle: Bilateral Morton's toe.  Hallux  rigidus on the left.  Bilateral Morton's callus. 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 Curtis reviewed - Patient candidate for custom orthotic.  This was fabricated today as noted below.  Patient was fitted for a : Fit 'n Run semi-rigid orthotic  The orthotic was heated, placed on the orthotic stand. The patient was positioned in subtalar neutral position and 10 degrees of ankle dorsiflexion in a weight bearing stance on the heated orthotic blank After completion of molding Blank: Fit 'n Run - size 8 Posting:  LT 1st ray post with blue poron Base: none - built-in base with Fit and Run  Orthotic was comfortable in the office and she had neutral alignment. Should follow-up with Dr. Curtis as scheduled    Patient expressed understanding & agreement with above.  Encounter Diagnosis  Name Primary?   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

## 2024-07-31 DIAGNOSIS — B079 Viral wart, unspecified: Secondary | ICD-10-CM | POA: Diagnosis not present

## 2024-08-06 ENCOUNTER — Encounter: Payer: Self-pay | Admitting: Physician Assistant

## 2024-08-06 NOTE — Telephone Encounter (Signed)
 Requesting rx rf of wegovy  2.4 Last written 04/23/2024 Last OV 05/06/2024 Upcoming appt 08/19/2024 Dr. Curtis

## 2024-08-07 ENCOUNTER — Other Ambulatory Visit: Payer: Self-pay

## 2024-08-07 DIAGNOSIS — M19079 Primary osteoarthritis, unspecified ankle and foot: Secondary | ICD-10-CM

## 2024-08-07 MED ORDER — WEGOVY 2.4 MG/0.75ML ~~LOC~~ SOAJ: 2.4000 mg | SUBCUTANEOUS | 3 refills | Status: DC

## 2024-08-18 ENCOUNTER — Encounter: Payer: Self-pay | Admitting: Sports Medicine

## 2024-08-19 ENCOUNTER — Ambulatory Visit (INDEPENDENT_AMBULATORY_CARE_PROVIDER_SITE_OTHER)

## 2024-08-19 ENCOUNTER — Other Ambulatory Visit: Payer: Self-pay

## 2024-08-19 ENCOUNTER — Ambulatory Visit: Admitting: Sports Medicine

## 2024-08-19 VITALS — BP 104/82 | Ht 67.0 in | Wt 200.0 lb

## 2024-08-19 DIAGNOSIS — M19079 Primary osteoarthritis, unspecified ankle and foot: Secondary | ICD-10-CM

## 2024-08-19 DIAGNOSIS — M79675 Pain in left toe(s): Secondary | ICD-10-CM | POA: Diagnosis not present

## 2024-08-19 MED ORDER — METHYLPREDNISOLONE ACETATE 40 MG/ML IJ SUSP
40.0000 mg | Freq: Once | INTRAMUSCULAR | Status: AC
Start: 2024-08-19 — End: 2024-08-19
  Administered 2024-08-19: 20 mg via INTRA_ARTICULAR

## 2024-08-19 NOTE — Progress Notes (Signed)
   Subjective:    Patient ID: April Jarvis, female    DOB: 43 y.o., [birth year]   MRN: [id number]  HPI  Chief Complaint: Follow-up on primary osteoarthritis of left first MTP joint  Patient previously seen by Dr. Curtis on 07/06/2024 X-rays at that time confirming arthritis, and her left first MTP joint. Initiated Morton's plate with first metatarsal extension for 4 weeks Will get fitted for custom molded orthotics Avoiding barefoot walking HEP provided Recommended 6-week follow-up and consider injection into left first MTP if not better.    Objective:   Physical Exam Vitals:   08/19/24 0821  BP: 104/82   Left foot: Tenderness to palpation over the proximal aspect of the  proximal first phalanx adjacent to the MTP joint.  Pain generated in this area with squeeze test.  Limited ultrasound examination of the left first MTP joint: Significant osteophytic change noted on the distal aspect of the metatarsal head in this area on the dorsal aspect. Significant hypoechogenic fluid signal coming from within the first MTP joint Minimal hypoechogenic fluid signal of the second MTP joint.  Virtually nonvisualized in the 3rd, 4th or 5th.  Impression: Osteoarthritis of the first MTP joint with effusion and large osteophytic change dorsally   Great Toe MTP Joint Injection with Ultrasound Guidance Procedure Note April Jarvis 23-Sep-1981 Indications: Pain Procedure Details Verbal consent obtained from patient. Risks (including potential risk of bleeding, rare risk of tendon injury), benefits, and alternatives reviewed. The left great toe MTP joint was identified. Prepped with Chloraprep. Ethyl Chloride for anesthesia. Great toe held in traction and plantarflexed 30 degrees. Under sterile conditions, needle inserted perpendicularly and 0.5cc of Mepivicaine 2% and 20 mg Depo-Medrol  injected. No resistance met. No blood on aspiration. Decreased pain post-injection.  Assessment & Plan:    April Jarvis is a 43 y.o. female presenting for follow-up chronic pain around the first MTP joint of right foot.  After using Morton's plate and custom orthotic insoles with continued minimal relief, I believe that she would likely benefit from an intra-articular steroid injection.  We discussed the pros and cons of this and ultimately decided to proceed with that today.  She tolerated the procedure well and I recommend continued use of the Morton's plate for the next couple of weeks.  Follow-up as needed

## 2024-08-19 NOTE — Addendum Note (Signed)
 Addended by: MARCINE HARLENE SAILOR on: 08/19/2024 09:08 AM   Modules accepted: Orders

## 2024-08-25 ENCOUNTER — Other Ambulatory Visit: Payer: Self-pay

## 2024-08-25 DIAGNOSIS — M19079 Primary osteoarthritis, unspecified ankle and foot: Secondary | ICD-10-CM

## 2024-08-25 MED ORDER — MELOXICAM 15 MG PO TABS: ORAL_TABLET | ORAL | 0 refills | Status: AC

## 2024-10-28 ENCOUNTER — Encounter: Payer: Self-pay | Admitting: Physician Assistant

## 2024-10-28 DIAGNOSIS — F410 Panic disorder [episodic paroxysmal anxiety] without agoraphobia: Secondary | ICD-10-CM

## 2024-10-28 DIAGNOSIS — Z1211 Encounter for screening for malignant neoplasm of colon: Secondary | ICD-10-CM

## 2024-10-28 MED ORDER — LORAZEPAM 0.5 MG PO TABS: 0.5000 mg | ORAL_TABLET | Freq: Two times a day (BID) | ORAL | 2 refills | Status: AC | PRN

## 2024-11-09 ENCOUNTER — Encounter: Payer: Self-pay | Admitting: Physician Assistant

## 2024-11-10 NOTE — Addendum Note (Signed)
 Addended by: Kazuki Ingle P on: 11/10/2024 08:26 AM   Modules accepted: Orders

## 2024-11-10 NOTE — Addendum Note (Signed)
 Addended by: Tobin Cadiente D on: 11/10/2024 10:05 AM   Modules accepted: Orders

## 2024-11-10 NOTE — Telephone Encounter (Signed)
 Forwarding message to Dr. Alvan covering April Jarvis, April Jarvis Requesting rx rf of Wegovy  2.4mg   Last written 06/23/2024 (3ml - no refills )  Last OV 05/06/2024 Upcoming appt = none

## 2024-11-10 NOTE — Telephone Encounter (Signed)
 Thsi looks like -Re-start.  Needs appt with Jade

## 2024-11-24 ENCOUNTER — Other Ambulatory Visit: Payer: Self-pay | Admitting: Physician Assistant

## 2024-11-24 DIAGNOSIS — R232 Flushing: Secondary | ICD-10-CM

## 2024-11-24 DIAGNOSIS — F411 Generalized anxiety disorder: Secondary | ICD-10-CM

## 2024-11-24 DIAGNOSIS — F410 Panic disorder [episodic paroxysmal anxiety] without agoraphobia: Secondary | ICD-10-CM

## 2025-01-11 ENCOUNTER — Other Ambulatory Visit: Payer: Self-pay | Admitting: Physician Assistant

## 2025-01-11 DIAGNOSIS — R41840 Attention and concentration deficit: Secondary | ICD-10-CM

## 2025-01-15 ENCOUNTER — Encounter: Payer: Self-pay | Admitting: Gastroenterology

## 2025-02-19 ENCOUNTER — Encounter

## 2025-02-19 ENCOUNTER — Encounter: Admitting: Physician Assistant

## 2025-03-05 ENCOUNTER — Encounter: Admitting: Gastroenterology
